# Patient Record
Sex: Female | Born: 1935 | Race: Black or African American | Hispanic: No | Smoking: Former smoker
Health system: Southern US, Community
[De-identification: ages and names within clinical notes are randomized; demographics above are authoritative.]

## PROBLEM LIST (undated history)

## (undated) DIAGNOSIS — E7800 Pure hypercholesterolemia, unspecified: Secondary | ICD-10-CM

## (undated) DIAGNOSIS — I10 Essential (primary) hypertension: Secondary | ICD-10-CM

## (undated) HISTORY — PX: TOTAL HIP ARTHROPLASTY: SHX124

## (undated) HISTORY — PX: JOINT REPLACEMENT: SHX530

---

## 2009-10-12 ENCOUNTER — Ambulatory Visit: Payer: Self-pay | Admitting: Diagnostic Radiology

## 2009-10-12 ENCOUNTER — Emergency Department (HOSPITAL_BASED_OUTPATIENT_CLINIC_OR_DEPARTMENT_OTHER): Admission: EM | Admit: 2009-10-12 | Discharge: 2009-10-12 | Payer: Self-pay | Admitting: Emergency Medicine

## 2010-11-29 LAB — BASIC METABOLIC PANEL
BUN: 12 mg/dL (ref 6–23)
CO2: 30 mEq/L (ref 19–32)
Chloride: 106 mEq/L (ref 96–112)
Glucose, Bld: 97 mg/dL (ref 70–99)
Potassium: 4 mEq/L (ref 3.5–5.1)
Sodium: 146 mEq/L — ABNORMAL HIGH (ref 135–145)

## 2010-11-29 LAB — CBC
Hemoglobin: 12.4 g/dL (ref 12.0–15.0)
MCHC: 32.7 g/dL (ref 30.0–36.0)
RBC: 4.42 MIL/uL (ref 3.87–5.11)
WBC: 5.2 10*3/uL (ref 4.0–10.5)

## 2010-11-29 LAB — DIFFERENTIAL
Basophils Relative: 0 % (ref 0–1)
Eosinophils Relative: 6 % — ABNORMAL HIGH (ref 0–5)
Lymphocytes Relative: 22 % (ref 12–46)
Lymphs Abs: 1.1 10*3/uL (ref 0.7–4.0)
Monocytes Absolute: 0.3 10*3/uL (ref 0.1–1.0)
Neutro Abs: 3.5 10*3/uL (ref 1.7–7.7)

## 2010-11-29 LAB — URINE MICROSCOPIC-ADD ON

## 2010-11-29 LAB — URINALYSIS, ROUTINE W REFLEX MICROSCOPIC
Nitrite: NEGATIVE
Protein, ur: NEGATIVE mg/dL
Specific Gravity, Urine: 1.018 (ref 1.005–1.030)
Urobilinogen, UA: 1 mg/dL (ref 0.0–1.0)

## 2010-11-29 LAB — POCT CARDIAC MARKERS
CKMB, poc: <1 ng/mL — ABNORMAL LOW (ref 1.0–8.0)
Myoglobin, poc: 82.9 ng/mL (ref 12–200)

## 2014-01-16 DIAGNOSIS — M810 Age-related osteoporosis without current pathological fracture: Secondary | ICD-10-CM | POA: Insufficient documentation

## 2014-12-18 ENCOUNTER — Emergency Department (HOSPITAL_BASED_OUTPATIENT_CLINIC_OR_DEPARTMENT_OTHER)
Admission: EM | Admit: 2014-12-18 | Discharge: 2014-12-19 | Disposition: A | Discharge status: Home / Self Care | Payer: Medicare HMO | Attending: Emergency Medicine | Admitting: Emergency Medicine

## 2014-12-18 ENCOUNTER — Encounter (HOSPITAL_BASED_OUTPATIENT_CLINIC_OR_DEPARTMENT_OTHER): Payer: Self-pay | Admitting: *Deleted

## 2014-12-18 DIAGNOSIS — Y9301 Activity, walking, marching and hiking: Secondary | ICD-10-CM | POA: Insufficient documentation

## 2014-12-18 DIAGNOSIS — S299XXA Unspecified injury of thorax, initial encounter: Secondary | ICD-10-CM | POA: Diagnosis not present

## 2014-12-18 DIAGNOSIS — Y9289 Other specified places as the place of occurrence of the external cause: Secondary | ICD-10-CM | POA: Diagnosis not present

## 2014-12-18 DIAGNOSIS — T07XXXA Unspecified multiple injuries, initial encounter: Secondary | ICD-10-CM

## 2014-12-18 DIAGNOSIS — W010XXA Fall on same level from slipping, tripping and stumbling without subsequent striking against object, initial encounter: Secondary | ICD-10-CM | POA: Diagnosis not present

## 2014-12-18 DIAGNOSIS — Y998 Other external cause status: Secondary | ICD-10-CM | POA: Diagnosis not present

## 2014-12-18 DIAGNOSIS — W19XXXA Unspecified fall, initial encounter: Secondary | ICD-10-CM

## 2014-12-18 DIAGNOSIS — T148 Other injury of unspecified body region: Secondary | ICD-10-CM | POA: Diagnosis not present

## 2014-12-18 DIAGNOSIS — S79911A Unspecified injury of right hip, initial encounter: Secondary | ICD-10-CM | POA: Insufficient documentation

## 2014-12-18 NOTE — ED Notes (Signed)
She fell 30 minutes ago. She tripped. Pain in the right side of her body.

## 2014-12-19 ENCOUNTER — Emergency Department (HOSPITAL_BASED_OUTPATIENT_CLINIC_OR_DEPARTMENT_OTHER): Payer: Medicare HMO

## 2014-12-19 NOTE — ED Provider Notes (Signed)
CSN: [id number]     Arrival date & time 12/18/14  2147 History   First MD Initiated Contact with Patient 12/19/14 0010     Chief Complaint  Patient presents with  . Fall     (Consider location/radiation/quality/duration/timing/severity/associated sxs/prior Treatment) HPI  This is a 79 year old female who was walking to her car about 9:30 PM yesterday evening. She tripped and fell landing on her right side. She is having pain in her right posterior lower ribs and in her right hip. Pain was "pretty bad" at first but it is eased off. Pain is worse with ambulation, though she is able to bear weight, and palpation. There was no loss of consciousness.  History reviewed. No pertinent past medical history. Past Surgical History  Procedure Laterality Date  . Total hip arthroplasty     No family history on file. History  Substance Use Topics  . Smoking status: Never Smoker   . Smokeless tobacco: Not on file  . Alcohol Use: No   OB History    No data available     Review of Systems  All other systems reviewed and are negative.   Allergies  Review of patient's allergies indicates no known allergies.  Home Medications   Prior to Admission medications   Not on File   BP 183/69 mmHg  Pulse 80  Temp(Src) 98 F (36.7 C) (Oral)  Resp 18  Ht 5\' 4"  (1.626 m)  Wt 130 lb (58.968 kg)  BMI 22.30 kg/m2  SpO2 98%   Physical Exam  General: Well-developed, well-nourished female in no acute distress; appearance consistent with age of record HENT: normocephalic; atraumatic Eyes: pupils equal, round and reactive to light; extraocular muscles intact Neck: supple; nontender Heart: regular rate and rhythm Lungs: clear to auscultation bilaterally; distant sounds Abdomen: soft; nondistended; nontender Back: Right lower posterior rib tenderness without crepitus Extremities: No deformity; full range of motion; pulses normal; tenderness of right inferior pubic ramus with pain on range of motion  of right hip Neurologic: Awake, alert and oriented; motor function intact in all extremities and symmetric; no facial droop Skin: Warm and dry Psychiatric: Normal mood and affect    ED Course  Procedures (including critical care time)   MDM  Nursing notes and vitals signs, including pulse oximetry, reviewed.  Summary of this visit's results, reviewed by myself:  Imaging Studies: Dg Ribs Unilateral W/chest Right  12/19/2014   CLINICAL DATA:  Right lower posterior rib pain after a fall, landing on cement this morning.  EXAM: RIGHT RIBS AND CHEST - 3+ VIEW  COMPARISON:  10/12/2009  FINDINGS: Normal heart size and pulmonary vascularity. No focal airspace disease or consolidation in the lungs. No blunting of costophrenic angles. No pneumothorax. Mediastinal contours appear intact. Calcification of the aorta. Degenerative changes in the spine and shoulders.  Right ribs appear intact. No acute displaced fractures or focal bone lesions identified.  IMPRESSION: No evidence of active pulmonary disease. No acute displaced right rib fractures.   Electronically Signed   By: Burman NievesWilliam  Stevens M.D.   On: 12/19/2014 01:18   Dg Hip Unilat With Pelvis 2-3 Views Right  12/19/2014   CLINICAL DATA:  Right hip pain after a fall landing on cement this morning. Unable to bear weight.  EXAM: RIGHT HIP (WITH PELVIS) 2-3 VIEWS  COMPARISON:  None.  FINDINGS: Pelvis appears intact. No acute displaced fractures identified. SI joints and symphysis pubis are nondisplaced.  Right total hip arthroplasty with non cemented components. Components appear well seated.  No evidence of acute fracture or dislocation. Mild displacement of the lesser trochanteric fragment is probably chronic. Postoperative changes also demonstrated in the left hip with left total hip arthroplasty.  IMPRESSION: No acute bony abnormalities demonstrated. Bilateral hip arthroplasties.   Electronically Signed   By: Burman Nieves M.D.   On: 12/19/2014 01:19    1:27 AM The patient declines analgesics. She states she will take an over-the-counter analgesic at home if necessary.  Paula Libra, MD 12/19/14 [phone]

## 2014-12-19 NOTE — ED Notes (Signed)
Steady stream of visitors, several making different/same request which the pt refuses when ask

## 2014-12-22 DIAGNOSIS — M2550 Pain in unspecified joint: Secondary | ICD-10-CM | POA: Insufficient documentation

## 2015-12-18 DIAGNOSIS — M1990 Unspecified osteoarthritis, unspecified site: Secondary | ICD-10-CM | POA: Insufficient documentation

## 2015-12-18 DIAGNOSIS — M545 Low back pain, unspecified: Secondary | ICD-10-CM | POA: Insufficient documentation

## 2016-01-27 DIAGNOSIS — E538 Deficiency of other specified B group vitamins: Secondary | ICD-10-CM | POA: Diagnosis present

## 2016-02-21 ENCOUNTER — Emergency Department (HOSPITAL_BASED_OUTPATIENT_CLINIC_OR_DEPARTMENT_OTHER): Payer: Medicare HMO

## 2016-02-21 ENCOUNTER — Encounter (HOSPITAL_BASED_OUTPATIENT_CLINIC_OR_DEPARTMENT_OTHER): Payer: Self-pay | Admitting: *Deleted

## 2016-02-21 ENCOUNTER — Emergency Department (HOSPITAL_BASED_OUTPATIENT_CLINIC_OR_DEPARTMENT_OTHER)
Admission: EM | Admit: 2016-02-21 | Discharge: 2016-02-21 | Disposition: A | Discharge status: Home / Self Care | Payer: Medicare HMO | Attending: Emergency Medicine | Admitting: Emergency Medicine

## 2016-02-21 DIAGNOSIS — Z5181 Encounter for therapeutic drug level monitoring: Secondary | ICD-10-CM | POA: Insufficient documentation

## 2016-02-21 DIAGNOSIS — Z96641 Presence of right artificial hip joint: Secondary | ICD-10-CM | POA: Insufficient documentation

## 2016-02-21 DIAGNOSIS — I10 Essential (primary) hypertension: Secondary | ICD-10-CM | POA: Diagnosis not present

## 2016-02-21 DIAGNOSIS — R42 Dizziness and giddiness: Secondary | ICD-10-CM | POA: Diagnosis present

## 2016-02-21 DIAGNOSIS — Z87891 Personal history of nicotine dependence: Secondary | ICD-10-CM | POA: Insufficient documentation

## 2016-02-21 HISTORY — DX: Essential (primary) hypertension: I10

## 2016-02-21 HISTORY — DX: Pure hypercholesterolemia, unspecified: E78.00

## 2016-02-21 LAB — CBC
HCT: 36.4 % (ref 36.0–46.0)
Hemoglobin: 11.8 g/dL — ABNORMAL LOW (ref 12.0–15.0)
MCH: 27.7 pg (ref 26.0–34.0)
MCHC: 32.4 g/dL (ref 30.0–36.0)
MCV: 85.4 fL (ref 78.0–100.0)
PLATELETS: 294 10*3/uL (ref 150–400)
RBC: 4.26 MIL/uL (ref 3.87–5.11)
RDW: 15.7 % — AB (ref 11.5–15.5)
WBC: 7.7 10*3/uL (ref 4.0–10.5)

## 2016-02-21 LAB — URINALYSIS, ROUTINE W REFLEX MICROSCOPIC
Bilirubin Urine: NEGATIVE
GLUCOSE, UA: NEGATIVE mg/dL
HGB URINE DIPSTICK: NEGATIVE
KETONES UR: NEGATIVE mg/dL
Leukocytes, UA: NEGATIVE
Nitrite: NEGATIVE
PH: 7.5 (ref 5.0–8.0)
PROTEIN: NEGATIVE mg/dL
Specific Gravity, Urine: 1.01 (ref 1.005–1.030)

## 2016-02-21 LAB — COMPREHENSIVE METABOLIC PANEL
ALT: 10 U/L — ABNORMAL LOW (ref 14–54)
ANION GAP: 7 (ref 5–15)
AST: 14 U/L — ABNORMAL LOW (ref 15–41)
Albumin: 4.1 g/dL (ref 3.5–5.0)
Alkaline Phosphatase: 86 U/L (ref 38–126)
BUN: 15 mg/dL (ref 6–20)
CHLORIDE: 104 mmol/L (ref 101–111)
CO2: 28 mmol/L (ref 22–32)
Calcium: 9.6 mg/dL (ref 8.9–10.3)
Creatinine, Ser: 0.77 mg/dL (ref 0.44–1.00)
Glucose, Bld: 141 mg/dL — ABNORMAL HIGH (ref 65–99)
Potassium: 3.6 mmol/L (ref 3.5–5.1)
SODIUM: 139 mmol/L (ref 135–145)
Total Bilirubin: 0.6 mg/dL (ref 0.3–1.2)
Total Protein: 8.1 g/dL (ref 6.5–8.1)

## 2016-02-21 LAB — RAPID URINE DRUG SCREEN, HOSP PERFORMED
Amphetamines: NOT DETECTED
BARBITURATES: NOT DETECTED
BENZODIAZEPINES: NOT DETECTED
COCAINE: NOT DETECTED
OPIATES: NOT DETECTED
TETRAHYDROCANNABINOL: NOT DETECTED

## 2016-02-21 LAB — PROTIME-INR
INR: 0.95 (ref 0.00–1.49)
PROTHROMBIN TIME: 12.9 s (ref 11.6–15.2)

## 2016-02-21 LAB — DIFFERENTIAL
BASOS PCT: 0 %
Basophils Absolute: 0 10*3/uL (ref 0.0–0.1)
EOS ABS: 0.1 10*3/uL (ref 0.0–0.7)
EOS PCT: 2 %
Lymphocytes Relative: 12 %
Lymphs Abs: 0.9 10*3/uL (ref 0.7–4.0)
MONO ABS: 0.5 10*3/uL (ref 0.1–1.0)
Monocytes Relative: 6 %
Neutro Abs: 6.2 10*3/uL (ref 1.7–7.7)
Neutrophils Relative %: 80 %

## 2016-02-21 LAB — ETHANOL

## 2016-02-21 LAB — APTT: aPTT: 31 seconds (ref 24–37)

## 2016-02-21 LAB — TROPONIN I

## 2016-02-21 MED ORDER — PROMETHAZINE HCL 25 MG/ML IJ SOLN
12.5000 mg | Freq: Once | INTRAMUSCULAR | Status: AC
  Administered 2016-02-21: 12.5 mg via INTRAVENOUS
  Filled 2016-02-21: qty 1

## 2016-02-21 MED ORDER — ACETAMINOPHEN 325 MG PO TABS
650.0000 mg | ORAL_TABLET | Freq: Once | ORAL | Status: AC
  Administered 2016-02-21: 650 mg via ORAL
  Filled 2016-02-21: qty 2

## 2016-02-21 MED ORDER — MECLIZINE HCL 25 MG PO TABS: 25.0000 mg | ORAL_TABLET | Freq: Three times a day (TID) | ORAL | Status: DC | PRN

## 2016-02-21 MED ORDER — MECLIZINE HCL 25 MG PO TABS
25.0000 mg | ORAL_TABLET | Freq: Once | ORAL | Status: AC
  Administered 2016-02-21: 25 mg via ORAL
  Filled 2016-02-21: qty 1

## 2016-02-21 NOTE — Discharge Instructions (Signed)
Benign Positional Vertigo °Vertigo is the feeling that you or your surroundings are moving when they are not. Benign positional vertigo is the most common form of vertigo. The cause of this condition is not serious (is benign). This condition is triggered by certain movements and positions (is positional). This condition can be dangerous if it occurs while you are doing something that could endanger you or others, such as driving.  °CAUSES °In many cases, the cause of this condition is not known. It may be caused by a disturbance in an area of the inner ear that helps your brain to sense movement and balance. This disturbance can be caused by a viral infection (labyrinthitis), head injury, or repetitive motion. °RISK FACTORS °This condition is more likely to develop in: °· Women. °· People who are 50 years of age or older. °SYMPTOMS °Symptoms of this condition usually happen when you move your head or your eyes in different directions. Symptoms may start suddenly, and they usually last for less than a minute. Symptoms may include: °· Loss of balance and falling. °· Feeling like you are spinning or moving. °· Feeling like your surroundings are spinning or moving. °· Nausea and vomiting. °· Blurred vision. °· Dizziness. °· Involuntary eye movement (nystagmus). °Symptoms can be mild and cause only slight annoyance, or they can be severe and interfere with daily life. Episodes of benign positional vertigo may return (recur) over time, and they may be triggered by certain movements. Symptoms may improve over time. °DIAGNOSIS °This condition is usually diagnosed by medical history and a physical exam of the head, neck, and ears. You may be referred to a health care provider who specializes in ear, nose, and throat (ENT) problems (otolaryngologist) or a provider who specializes in disorders of the nervous system (neurologist). You may have additional testing, including: °· MRI. °· A CT scan. °· Eye movement tests. Your  health care provider may ask you to change positions quickly while he or she watches you for symptoms of benign positional vertigo, such as nystagmus. Eye movement may be tested with an electronystagmogram (ENG), caloric stimulation, the Dix-Hallpike test, or the roll test. °· An electroencephalogram (EEG). This records electrical activity in your brain. °· Hearing tests. °TREATMENT °Usually, your health care provider will treat this by moving your head in specific positions to adjust your inner ear back to normal. Surgery may be needed in severe cases, but this is rare. In some cases, benign positional vertigo may resolve on its own in 2-4 weeks. °HOME CARE INSTRUCTIONS °Safety °· Move slowly. Avoid sudden body or head movements. °· Avoid driving. °· Avoid operating heavy machinery. °· Avoid doing any tasks that would be dangerous to you or others if a vertigo episode would occur. °· If you have trouble walking or keeping your balance, try using a cane for stability. If you feel dizzy or unstable, sit down right away. °· Return to your normal activities as told by your health care provider. Ask your health care provider what activities are safe for you. °General Instructions °· Take over-the-counter and prescription medicines only as told by your health care provider. °· Avoid certain positions or movements as told by your health care provider. °· Drink enough fluid to keep your urine clear or pale yellow. °· Keep all follow-up visits as told by your health care provider. This is important. °SEEK MEDICAL CARE IF: °· You have a fever. °· Your condition gets worse or you develop new symptoms. °· Your family or friends   notice any behavioral changes. °· Your nausea or vomiting gets worse. °· You have numbness or a "pins and needles" sensation. °SEEK IMMEDIATE MEDICAL CARE IF: °· You have difficulty speaking or moving. °· You are always dizzy. °· You faint. °· You develop severe headaches. °· You have weakness in your  legs or arms. °· You have changes in your hearing or vision. °· You develop a stiff neck. °· You develop sensitivity to light. °  °This information is not intended to replace advice given to you by your health care provider. Make sure you discuss any questions you have with your health care provider. °  °Document Released: 06/06/2006 Document Revised: 05/20/2015 Document Reviewed: 12/22/2014 °Elsevier Interactive Patient Education ©2016 [employer] ° °Dizziness °Dizziness is a common problem. It makes you feel unsteady or lightheaded. You may feel like you are about to pass out (faint). Dizziness can lead to injury if you stumble or fall. Anyone can get dizzy, but dizziness is more common in older adults. This condition can be caused by a number of things, including: °· Medicines. °· Dehydration. °· Illness. °HOME CARE °Following these instructions may help with your condition: °Eating and Drinking °· Drink enough fluid to keep your pee (urine) clear or pale yellow. This helps to keep you from getting dehydrated. Try to drink more clear fluids, such as water. °· Do not drink alcohol. °· Limit how much caffeine you drink or eat if told by your doctor. °· Limit how much salt you drink or eat if told by your doctor. °Activity °· Avoid making quick movements. °¨ When you stand up from sitting in a chair, steady yourself until you feel okay. °¨ In the morning, first sit up on the side of the bed. When you feel okay, stand slowly while you hold onto something. Do this until you know that your balance is fine. °· Move your legs often if you need to stand in one place for a long time. Tighten and relax your muscles in your legs while you are standing. °· Do not drive or use heavy machinery if you feel dizzy. °· Avoid bending down if you feel dizzy. Place items in your home so that they are easy for you to reach without leaning over. °Lifestyle °· Do not use any tobacco products, including cigarettes, chewing tobacco, or  electronic cigarettes. If you need help quitting, ask your doctor. °· Try to lower your stress level, such as with yoga or meditation. Talk with your doctor if you need help. °General Instructions °· Watch your dizziness for any changes. °· Take medicines only as told by your doctor. Talk with your doctor if you think that your dizziness is caused by a medicine that you are taking. °· Tell a friend or a family member that you are feeling dizzy. If he or she notices any changes in your behavior, have this person call your doctor. °· Keep all follow-up visits as told by your doctor. This is important. °GET HELP IF: °· Your dizziness does not go away. °· Your dizziness or light-headedness gets worse. °· You feel sick to your stomach (nauseous). °· You have trouble hearing. °· You have new symptoms. °· You are unsteady on your feet or you feel like the room is spinning. °GET HELP RIGHT AWAY IF: °· You throw up (vomit) or have diarrhea and are unable to eat or drink anything. °· You have trouble: °¨ Talking. °¨ Walking. °¨ Swallowing. °¨ Using your arms, hands, or legs. °·   You feel generally weak. °· You are not thinking clearly or you have trouble forming sentences. It may take a friend or family member to notice this. °· You have: °¨ Chest pain. °¨ Pain in your belly (abdomen). °¨ Shortness of breath. °¨ Sweating. °· Your vision changes. °· You are bleeding. °· You have a headache. °· You have neck pain or a stiff neck. °· You have a fever. °  °This information is not intended to replace advice given to you by your health care provider. Make sure you discuss any questions you have with your health care provider. °  °Document Released: 08/18/2011 Document Revised: 01/13/2015 Document Reviewed: 08/25/2014 °Elsevier Interactive Patient Education ©2016 [employer]vier Inc. ° °

## 2016-02-21 NOTE — ED Provider Notes (Signed)
Medical screening examination/treatment/procedure(s) were conducted as a shared visit with non-physician practitioner(s) and myself.  I personally evaluated the patient during the encounter.   EKG Interpretation   Date/Time:  Sunday February 21 2016 09:25:43 EDT Ventricular Rate:  58 PR Interval:  131 QRS Duration: 108 QT Interval:  426 QTC Calculation: 418 R Axis:   39 Text Interpretation:  Sinus rhythm Atrial premature complexes in couplets  Probable left ventricular hypertrophy Baseline wander in lead(s) V6 ST  depression in inferolatera leads, seen on prior EKG  Confirmed by Denham Mose MD,  Jadore Veals ([id number]) on 02/21/2016 9:48:46 AM      79  year old female who presents with dizziness. She has a history of hypertension, hyperlipidemia, and a remote history of vertigo. States that around 10:50 PM yesterday evening began to have the sensation of dizziness. Denies syncope or near syncope and denies that the room is spinning or moving in front of her. However does state that whenever she moves her head forward she feels that she will fall over and lose balance. Similar to when she has had vertigo before in the past but has not had this in several years. No vision or speech changes, focal numbness or weakness, vomiting, severe chest pain or back pain, or any recent illnesses. On presentation she is hypertensive with systolic blood pressures of 203/65. She did take her blood pressure medication this morning, but she states that overall she does not take her blood pressure medications, only when she has a headache. Her neuro exam is overall intact aside from inability to ambulate by herself. She has no dysmetria with finger to nose, heel-knee-shin, no evidence of bite lateral or vertical nystagmus, abnormal test of skew. No other bulbar symptoms noted as well. Presentation seem consistent with likely peripheral vertigo, aside from gait instability.  She is out of tPA window if this is consistent with a posterior  circulation stroke. she will undergo basic stroke workup, however we'll treat her for peripheral vertigo during her workup. CT head visualized and does not show intracranial bleeding or other acute processes. She is given Phenergan and has resolution of dizziness. Is able to ambulate steadily. Unlikely to be CVA, and MRI imaging not felt to be needed at this time. Strict return and follow-up instructions reviewed. She expressed understanding of all discharge instructions and felt comfortable with the plan of care.   Lavera Guiseana Duo Aubrei Bouchie, MD 02/21/16 1630

## 2016-02-21 NOTE — ED Notes (Signed)
Pt reports sudden-onset of dizziness, lightheadedness and fatigue since last night around 2100. Reports associated nausea; denies v/d, chest pain, fever. Reports associated headache. Denies blurry vision, confusion.

## 2016-02-21 NOTE — ED Notes (Signed)
PA-C at bedside 

## 2016-02-21 NOTE — ED Provider Notes (Signed)
CSN: [id number]     Arrival date & time 02/21/16  0827 History   First MD Initiated Contact with Patient 02/21/16 0902     Chief Complaint  Patient presents with  . Dizziness     (Consider location/radiation/quality/duration/timing/severity/associated sxs/prior Treatment) HPI   80 year old female with hx of HTN currently on Hyzaar presenting with c/o dizziness.  Patient report acute onset of lightheadedness/dizziness which started last night around 2100 while she was sitting in her chair watching TV. She describes sensation as "feeling like I was, pass out". She does endorse a mild headache has been waxing waning. Report feeling fatigued, felt nauseous and this morning her symptoms persist despite laying in bed. Positional change does worsen her symptoms. Otherwise patient denies fever, confusion, dysphasia, diplopia, scintillating scotoma, ringing in ear, hearing changes, neck pain, chest pain, shortness of breath, abdominal pain, back pain, focal numbness, or rash. Denies any dysuria or productive cough. Denies any recent medication changes but states that she has not been compliant with her blood pressure medication, only take it when she has headaches. No history of prior stroke or MI. She does have history of right hip pain, currently awaits for hip surgery. She takes NSAIDs on occasion for her hip pain. She is not on any blood thinning medication. She is former smoker, denies alcohol or street drug use. Denies any recent sickness. Denies room spinning sensation.  Past Medical History  Diagnosis Date  . Hypertension   . Hypercholesteremia    Past Surgical History  Procedure Laterality Date  . Total hip arthroplasty    . Joint replacement      bil hips   No family history on file. Social History  Substance Use Topics  . Smoking status: Former Games developermoker  . Smokeless tobacco: None  . Alcohol Use: No   OB History    No data available     Review of Systems  All other systems  reviewed and are negative.     Allergies  Review of patient's allergies indicates no known allergies.  Home Medications   Prior to Admission medications   Medication Sig Start Date End Date Taking? Authorizing Provider  losartan-hydrochlorothiazide (HYZAAR) 100-12.5 MG tablet Take 1 tablet by mouth daily.   Yes Historical Provider, MD  pravastatin (PRAVACHOL) 20 MG tablet Take 20 mg by mouth daily.   Yes Historical Provider, MD   BP 203/65 mmHg  Pulse 60  Temp(Src) 98.6 F (37 C) (Oral)  Resp 15  Ht 5\' 4"  (1.626 m)  Wt 63.504 kg  BMI 24.02 kg/m2  SpO2 97% Physical Exam  Constitutional: She is oriented to person, place, and time. She appears well-developed and well-nourished. No distress.  African-American female laying in bed nontoxic in appearance  HENT:  Head: Atraumatic.  Right Ear: External ear normal.  Left Ear: External ear normal.  Left TM is dull but nonerythematous  Eyes: Conjunctivae and EOM are normal. Pupils are equal, round, and reactive to light.  Neck: Normal range of motion. Neck supple. No JVD present. No thyromegaly present.  Cardiovascular: Normal rate and regular rhythm.  Exam reveals no gallop and no friction rub.   No murmur heard. Pulmonary/Chest: Effort normal and breath sounds normal.  Abdominal: Soft. There is no tenderness.  Neurological: She is alert and oriented to person, place, and time.  Neurologic exam:  Speech clear, pupils 2mm, equal round reactive to light, extraocular movements intact  Normal peripheral visual fields Cranial nerves III through XII normal including no  facial droop Follows commands, moves all extremities x4, normal strength to bilateral upper and lower extremities at all major muscle groups including grip Sensation normal to light touch  Coordination intact, no limb ataxia, finger-nose-finger normal Rapid alternating movements normal No pronator drift Gait unsteady, difficulty walking without assistance   Skin: No  rash noted.  Psychiatric: She has a normal mood and affect.  Nursing note and vitals reviewed.   ED Course  Procedures (including critical care time) Labs Review Labs Reviewed  CBC - Abnormal; Notable for the following:    Hemoglobin 11.8 (*)    RDW 15.7 (*)    All other components within normal limits  COMPREHENSIVE METABOLIC PANEL - Abnormal; Notable for the following:    Glucose, Bld 141 (*)    AST 14 (*)    ALT 10 (*)    All other components within normal limits  URINALYSIS, ROUTINE W REFLEX MICROSCOPIC (NOT AT [facility]) - Abnormal; Notable for the following:    Color, Urine STRAW (*)    All other components within normal limits  ETHANOL  PROTIME-INR  APTT  DIFFERENTIAL  URINE RAPID DRUG SCREEN, HOSP PERFORMED  TROPONIN I    Imaging Review Ct Head Wo Contrast  02/21/2016  CLINICAL DATA:  80 year old female with sudden onset dizziness and nausea EXAM: CT HEAD WITHOUT CONTRAST TECHNIQUE: Contiguous axial images were obtained from the base of the skull through the vertex without intravenous contrast. COMPARISON:  Report from prior MR imaging dated 07/13/2005 available for review. Prior images are not available. FINDINGS: Negative for acute intracranial hemorrhage, acute infarction, mass, mass effect, hydrocephalus or midline shift. Gray-white differentiation is preserved throughout. Focal confluent hypoattenuation in the bilateral left slightly greater than right parietal and occipital subcortical and deep white matter extending to the periventricular white matter. This is similar to findings described on the prior MRI and therefore favored to represent chronic microvascular ischemic white matter disease. No focal soft tissue or calvarial abnormality. Visualized globes and orbits are unremarkable. IMPRESSION: 1. No acute intracranial abnormality. 2. 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 Electronically Signed   By: Malachy Moan M.D.   On: 02/21/2016 09:55   I have personally reviewed and evaluated these images and lab results as part of my medical decision-making.   EKG Interpretation   Date/Time:  Sunday February 21 2016 09:25:43 EDT Ventricular Rate:  58 PR Interval:  131 QRS Duration: 108 QT Interval:  426 QTC Calculation: 418 R Axis:   39 Text Interpretation:  Sinus rhythm Atrial premature complexes in couplets  Probable left ventricular hypertrophy Baseline wander in lead(s) V6 ST  depression in inferolatera leads, seen on prior EKG  Confirmed by LIU MD,  DANA ([id number]) on 02/21/2016 9:48:46 AM      MDM   Final diagnoses:  BPPV (benign paroxysmal positional vertigo), unspecified laterality    BP 171/63 mmHg  Pulse 59  Temp(Src) 98.6 F (37 C) (Oral)  Resp 19  Ht 5\' 4"  (1.626 m)  Wt 63.504 kg  BMI 24.02 kg/m2  SpO2 95%   9:26 AM Pt here with c/o dizziness and a mild headache. Last normal at 2100 last night.  She is hypertensive with BP of 203/65.  Aside from an unsteady gait, her HINTs exam is normal and no other focal neuro deficits appreciated.  No active CP/SOB.  No infectious sxs.  Work up initiated, prompt transfer to get head  CT scan.  Stroke work up initiated. Does have remote hx of vertigo.  Sxs may reflect BPPV.  Will give phenergan for sxs treatment.  Swallow study and orthostatic vital sign ordered.  Care discussed with Dr. Verdie Mosher.  10:47 AM Normal orthostatic vital sign.  Pt pass swallow study.  Head CT scan without acute intracranial pathology such as bleed.  Labs are reassuring.  Minimal improvement with Phenergan at this time. Will give Meclizine.  12:34 PM Pt report feeling much better after receiving treatment.  Dizziness resolved, still feel a bit lightheadedness.  She is able to ambulate using her cane.  After discussing with Dr. Verdie Mosher, we felt her symptom is likely peripheral vertigo and less likely central vertigo such as  posterior circulation stroke.  Will provide meclizine and have pt f/u with PCP for further care.    Fayrene Helper, PA-C 02/21/16 1330  Lavera Guise, MD 02/21/16 [phone]

## 2017-04-22 ENCOUNTER — Emergency Department (HOSPITAL_BASED_OUTPATIENT_CLINIC_OR_DEPARTMENT_OTHER): Payer: Medicare HMO

## 2017-04-22 ENCOUNTER — Observation Stay (HOSPITAL_BASED_OUTPATIENT_CLINIC_OR_DEPARTMENT_OTHER)
Admission: EM | Admit: 2017-04-22 | Discharge: 2017-04-23 | Disposition: A | Discharge status: Home / Self Care | Payer: Medicare HMO | Attending: Internal Medicine | Admitting: Internal Medicine

## 2017-04-22 ENCOUNTER — Encounter (HOSPITAL_BASED_OUTPATIENT_CLINIC_OR_DEPARTMENT_OTHER): Payer: Self-pay | Admitting: Emergency Medicine

## 2017-04-22 DIAGNOSIS — Z87891 Personal history of nicotine dependence: Secondary | ICD-10-CM | POA: Diagnosis not present

## 2017-04-22 DIAGNOSIS — N179 Acute kidney failure, unspecified: Secondary | ICD-10-CM | POA: Diagnosis not present

## 2017-04-22 DIAGNOSIS — I10 Essential (primary) hypertension: Secondary | ICD-10-CM | POA: Diagnosis not present

## 2017-04-22 DIAGNOSIS — Z79899 Other long term (current) drug therapy: Secondary | ICD-10-CM | POA: Diagnosis not present

## 2017-04-22 DIAGNOSIS — Z96643 Presence of artificial hip joint, bilateral: Secondary | ICD-10-CM | POA: Diagnosis not present

## 2017-04-22 DIAGNOSIS — R55 Syncope and collapse: Principal | ICD-10-CM | POA: Diagnosis present

## 2017-04-22 DIAGNOSIS — E7800 Pure hypercholesterolemia, unspecified: Secondary | ICD-10-CM | POA: Diagnosis not present

## 2017-04-22 LAB — COMPREHENSIVE METABOLIC PANEL
ALT: 10 U/L — ABNORMAL LOW (ref 14–54)
ANION GAP: 12 (ref 5–15)
AST: 17 U/L (ref 15–41)
Albumin: 4.3 g/dL (ref 3.5–5.0)
Alkaline Phosphatase: 89 U/L (ref 38–126)
BUN: 21 mg/dL — ABNORMAL HIGH (ref 6–20)
CO2: 26 mmol/L (ref 22–32)
Calcium: 9.6 mg/dL (ref 8.9–10.3)
Chloride: 100 mmol/L — ABNORMAL LOW (ref 101–111)
Creatinine, Ser: 1.12 mg/dL — ABNORMAL HIGH (ref 0.44–1.00)
GFR calc non Af Amer: 46 mL/min — ABNORMAL LOW (ref >60)
GFR, EST AFRICAN AMERICAN: 53 mL/min — AB (ref >60)
Glucose, Bld: 116 mg/dL — ABNORMAL HIGH (ref 65–99)
Potassium: 5.1 mmol/L (ref 3.5–5.1)
SODIUM: 138 mmol/L (ref 135–145)
Total Bilirubin: 0.9 mg/dL (ref 0.3–1.2)
Total Protein: 8.2 g/dL — ABNORMAL HIGH (ref 6.5–8.1)

## 2017-04-22 LAB — CBC WITH DIFFERENTIAL/PLATELET
Basophils Absolute: 0 10*3/uL (ref 0.0–0.1)
Basophils Relative: 0 %
EOS ABS: 0 10*3/uL (ref 0.0–0.7)
EOS PCT: 0 %
HCT: 32.6 % — ABNORMAL LOW (ref 36.0–46.0)
Hemoglobin: 10.5 g/dL — ABNORMAL LOW (ref 12.0–15.0)
LYMPHS ABS: 1 10*3/uL (ref 0.7–4.0)
Lymphocytes Relative: 10 %
MCH: 27.9 pg (ref 26.0–34.0)
MCHC: 32.2 g/dL (ref 30.0–36.0)
MCV: 86.5 fL (ref 78.0–100.0)
MONO ABS: 0.5 10*3/uL (ref 0.1–1.0)
MONOS PCT: 5 %
Neutro Abs: 8.4 10*3/uL — ABNORMAL HIGH (ref 1.7–7.7)
Neutrophils Relative %: 85 %
PLATELETS: 249 10*3/uL (ref 150–400)
RBC: 3.77 MIL/uL — ABNORMAL LOW (ref 3.87–5.11)
RDW: 14.9 % (ref 11.5–15.5)
WBC: 10 10*3/uL (ref 4.0–10.5)

## 2017-04-22 LAB — TROPONIN I
TROPONIN I: 0.04 ng/mL — AB (ref <0.03)
TROPONIN I: 0.04 ng/mL — AB (ref <0.03)

## 2017-04-22 MED ORDER — SODIUM CHLORIDE 0.9% FLUSH
3.0000 mL | Freq: Two times a day (BID) | INTRAVENOUS | Status: DC
  Administered 2017-04-22 – 2017-04-23 (×2): 3 mL via INTRAVENOUS

## 2017-04-22 MED ORDER — HYDROCHLOROTHIAZIDE 12.5 MG PO CAPS: 12.5000 mg | ORAL_CAPSULE | Freq: Every day | ORAL | Status: DC

## 2017-04-22 MED ORDER — ACETAMINOPHEN 325 MG PO TABS
650.0000 mg | ORAL_TABLET | Freq: Four times a day (QID) | ORAL | Status: DC | PRN
  Administered 2017-04-23: 650 mg via ORAL
  Filled 2017-04-22: qty 2

## 2017-04-22 MED ORDER — LOSARTAN POTASSIUM 50 MG PO TABS: 100.0000 mg | ORAL_TABLET | Freq: Every day | ORAL | Status: DC

## 2017-04-22 MED ORDER — PRAVASTATIN SODIUM 20 MG PO TABS
20.0000 mg | ORAL_TABLET | Freq: Every day | ORAL | Status: DC
  Administered 2017-04-22 – 2017-04-23 (×2): 20 mg via ORAL
  Filled 2017-04-22 (×2): qty 1

## 2017-04-22 MED ORDER — BENZONATATE 100 MG PO CAPS: 200.0000 mg | ORAL_CAPSULE | Freq: Three times a day (TID) | ORAL | Status: DC | PRN

## 2017-04-22 MED ORDER — ENOXAPARIN SODIUM 40 MG/0.4ML ~~LOC~~ SOLN
40.0000 mg | SUBCUTANEOUS | Status: DC
  Administered 2017-04-22: 40 mg via SUBCUTANEOUS
  Filled 2017-04-22: qty 0.4

## 2017-04-22 MED ORDER — SODIUM CHLORIDE 0.9 % IV SOLN
INTRAVENOUS | Status: DC
  Administered 2017-04-22: 23:00:00 via INTRAVENOUS

## 2017-04-22 MED ORDER — SODIUM CHLORIDE 0.9 % IV BOLUS (SEPSIS)
1000.0000 mL | Freq: Once | INTRAVENOUS | Status: AC
  Administered 2017-04-22: 1000 mL via INTRAVENOUS

## 2017-04-22 MED ORDER — LOSARTAN POTASSIUM 50 MG PO TABS
100.0000 mg | ORAL_TABLET | Freq: Every day | ORAL | Status: DC
  Administered 2017-04-23: 100 mg via ORAL
  Filled 2017-04-22: qty 2

## 2017-04-22 MED ORDER — ACETAMINOPHEN 325 MG PO TABS
650.0000 mg | ORAL_TABLET | Freq: Once | ORAL | Status: AC
  Administered 2017-04-22: 650 mg via ORAL
  Filled 2017-04-22: qty 2

## 2017-04-22 MED ORDER — HYDROCHLOROTHIAZIDE 12.5 MG PO CAPS
12.5000 mg | ORAL_CAPSULE | Freq: Every day | ORAL | Status: DC
  Administered 2017-04-23: 12.5 mg via ORAL
  Filled 2017-04-22: qty 1

## 2017-04-22 MED ORDER — HYDRALAZINE HCL 20 MG/ML IJ SOLN
5.0000 mg | INTRAMUSCULAR | Status: DC | PRN
  Administered 2017-04-23: 5 mg via INTRAVENOUS
  Filled 2017-04-22 (×2): qty 1

## 2017-04-22 MED ORDER — MECLIZINE HCL 25 MG PO TABS: 25.0000 mg | ORAL_TABLET | Freq: Three times a day (TID) | ORAL | Status: DC | PRN

## 2017-04-22 MED ORDER — LOSARTAN POTASSIUM-HCTZ 100-12.5 MG PO TABS: 1.0000 | ORAL_TABLET | Freq: Every day | ORAL | Status: DC

## 2017-04-22 NOTE — ED Notes (Signed)
Date and time results received: 04/22/17, 1627  Test:Troponin  Critical Value: 0.04  [facility] Not[facility]   Orders Received? Or Actions Taken?:

## 2017-04-22 NOTE — ED Notes (Signed)
Report given to Kelly, RN.

## 2017-04-22 NOTE — ED Notes (Signed)
Patient states she has a headache but does not want any medication. Pain 2 out of 10

## 2017-04-22 NOTE — ED Notes (Signed)
Pt on cardiac monitor and auto VS 

## 2017-04-22 NOTE — ED Notes (Signed)
Sprite and graham crackers given to patient, ok per MD

## 2017-04-22 NOTE — Progress Notes (Signed)
received call from [facility] regarding observation placement for 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 Also found dehydrated and with mild AKI. Troponin borderline elevated, most likely in setting of AKI. No CP reported. accepted to telemetry for further evaluation and treatment. Some non-specific EKG changes appreciated on V1-V2 (T wave inversion)   Vassie LollMadera, Ronnel Zuercher MD [phone]

## 2017-04-22 NOTE — H&P (Signed)
History and Physical    Crystal Richard Jeffords EXB:[id number] DOB: 09/21/1935 DOA: 04/22/2017  PCP: Cheron SchaumannVelazquez, Gretchen Y., MD  Patient coming from: Home  I have personally briefly reviewed patient's old medical records [facility]  Chief Complaint: Syncope  HPI: Crystal Richard Oland is a 81 y.o. female with medical history significant of HTN, HLD.  [facility] a near-syncopal event.  Had been going door to door outside in the heat and hadnt drank any water today prior to event.  No CP, SOB before, during, or after the event.   ED Course: Trop of 0.04, Creat of 1.1   Review of Systems: As per HPI otherwise 10 point review of systems negative.   Past Medical History:  Diagnosis Date  . Hypercholesteremia   . Hypertension     Past Surgical History:  Procedure Laterality Date  . JOINT REPLACEMENT     bil hips  . TOTAL HIP ARTHROPLASTY       reports that she has quit smoking. She has never used smokeless tobacco. She reports that she does not drink alcohol or use drugs.  No Known Allergies  History reviewed. No pertinent family history.   Prior to Admission medications   Medication Sig Start Date End Date Taking? Authorizing Provider  losartan-hydrochlorothiazide (HYZAAR) 100-12.5 MG tablet Take 1 tablet by mouth daily.    [provider]  meclizine (ANTIVERT) 25 MG tablet Take 1 tablet (25 mg total) by mouth 3 (three) times daily as needed for dizziness or nausea. 02/21/16   Fayrene Helperran, [place], PA-C  pravastatin (PRAVACHOL) 20 MG tablet Take 20 mg by mouth daily.    [provider]    Physical Exam: Vitals:   04/22/17 1830 04/22/17 1900 04/22/17 2000 04/22/17 2106  BP: (!) 119/107 (!) 132/57 (!) 150/61 (!) 176/54  Pulse: 75 69 66 62  Resp: 14 15 16 16   Temp:    98.9 F (37.2 C)  TempSrc:    Oral  SpO2: 100% 100% 100% 100%  Weight:      Height:        Constitutional: NAD, calm, comfortable Eyes: PERRL, lids and conjunctivae  normal ENMT: Mucous membranes are moist. Posterior pharynx clear of any exudate or lesions.Normal dentition.  Neck: normal, supple, no masses, no thyromegaly Respiratory: clear to auscultation bilaterally, no wheezing, no crackles. Normal respiratory effort. No accessory muscle use.  Cardiovascular: Regular rate and rhythm, no murmurs / rubs / gallops. No extremity edema. 2+ pedal pulses. No carotid bruits.  Abdomen: no tenderness, no masses palpated. No hepatosplenomegaly. Bowel sounds positive.  Musculoskeletal: no clubbing / cyanosis. No joint deformity upper and lower extremities. Good ROM, no contractures. Normal muscle tone.  Skin: no rashes, lesions, ulcers. No induration Neurologic: CN 2-12 grossly intact. Sensation intact, DTR normal. Strength 5/5 in all 4.  Psychiatric: Normal judgment and insight. Alert and oriented x 3. Normal mood.    Labs on Admission: I have personally reviewed following labs and imaging studies  CBC:  Recent Labs Lab 04/22/17 1544  WBC 10.0  NEUTROABS 8.4*  HGB 10.5*  HCT 32.6*  MCV 86.5  PLT 249   Basic Metabolic Panel:  Recent Labs Lab 04/22/17 1544  NA 138  K 5.1  CL 100*  CO2 26  GLUCOSE 116*  BUN 21*  CREATININE 1.12*  CALCIUM 9.6   GFR: Estimated Creatinine Clearance: 37.5 mL/min (A) (by C-G formula based on SCr of 1.12 mg/dL (H)). Liver Function Tests:  Recent  Labs Lab 04/22/17 1544  AST 17  ALT 10*  ALKPHOS 89  BILITOT 0.9  PROT 8.2*  ALBUMIN 4.3   No results for input(s): LIPASE, AMYLASE in the last 168 hours. No results for input(s): AMMONIA in the last 168 hours. Coagulation Profile: No results for input(s): INR, PROTIME in the last 168 hours. Cardiac Enzymes:  Recent Labs Lab 04/22/17 1544  TROPONINI 0.04*   BNP (last 3 results) No results for input(s): PROBNP in the last 8760 hours. HbA1C: No results for input(s): HGBA1C in the last 72 hours. CBG: No results for input(s): GLUCAP in the last 168  hours. Lipid Profile: No results for input(s): CHOL, HDL, LDLCALC, TRIG, CHOLHDL, LDLDIRECT in the last 72 hours. Thyroid Function Tests: No results for input(s): TSH, T4TOTAL, FREET4, T3FREE, THYROIDAB in the last 72 hours. Anemia Panel: No results for input(s): VITAMINB12, FOLATE, FERRITIN, TIBC, IRON, RETICCTPCT in the last 72 hours. Urine analysis:    Component Value Date/Time   COLORURINE STRAW (A) 02/21/2016 1000   APPEARANCEUR CLEAR 02/21/2016 1000   LABSPEC 1.010 02/21/2016 1000   PHURINE 7.5 02/21/2016 1000   GLUCOSEU NEGATIVE 02/21/2016 1000   HGBUR NEGATIVE 02/21/2016 1000   BILIRUBINUR NEGATIVE 02/21/2016 1000   KETONESUR NEGATIVE 02/21/2016 1000   PROTEINUR NEGATIVE 02/21/2016 1000   UROBILINOGEN 1.0 10/12/2009 1004   NITRITE NEGATIVE 02/21/2016 1000   LEUKOCYTESUR NEGATIVE 02/21/2016 1000    Radiological Exams on Admission: Dg Chest 2 View  Result Date: 04/22/2017 CLINICAL DATA:  Chest pain EXAM: CHEST  2 VIEW COMPARISON:  05/11/2016 FINDINGS: Cardiac shadow is within normal limits. Aortic calcifications are seen. The lungs are well aerated bilaterally. No acute bony abnormality is noted. IMPRESSION: No active cardiopulmonary disease. Electronically Signed   By: Alcide Clever M.D.   On: 04/22/2017 15:48   Ct Head Wo Contrast  Result Date: 04/22/2017 CLINICAL DATA:  Dizziness EXAM: CT HEAD WITHOUT CONTRAST TECHNIQUE: Contiguous axial images were obtained from the base of the skull through the vertex without intravenous contrast. COMPARISON:  02/21/2016 FINDINGS: Brain: No evidence of acute infarction, hemorrhage, hydrocephalus, extra-axial collection or mass lesion/mass effect. Vascular: No hyperdense vessel or unexpected calcification. Skull: Normal. Negative for fracture or focal lesion. Sinuses/Orbits: No acute finding. Other: None. IMPRESSION: No acute intracranial abnormality noted. Electronically Signed   By: Alcide Clever M.D.   On: 04/22/2017 15:47    EKG:  Independently reviewed.  Assessment/Plan Principal Problem:   Near syncope    1. Near syncope - 1. Probably dehydration 2. Syncope pathway 3. Tele monitor 4. Serial trops 5. 2d echo 6. IVF: 1L bolus in ED and 100 cc/hr 7. Repeat BMP ordered for AM  DVT prophylaxis: Lovenox Code Status: Full Code, Note: Patient is a Jehova's witness and refuses blood transfusions Family Communication: Family at bedside Disposition Plan: Home after admit Consults called: None Admission status: Place in [place], Heywood Iles. DO [facility] Pager [phone]  If 7AM-7PM, please contact day team taking care of patient [email or web address] Password [email or web address]  04/22/2017, 10:04 PM

## 2017-04-22 NOTE — ED Triage Notes (Signed)
Patient states that she was out walking and started to get dizzy. The patient went to an urgent care and was sent here to have her heart evaluated,. Denies any dizziness while sitting. REports increased dizzy with head movement. [facility] and here

## 2017-04-22 NOTE — ED Provider Notes (Signed)
[facility] Provider Note   CSN: [id number] Arrival date & time: 04/22/17  1356     History   Chief Complaint Chief Complaint  Patient presents with  . Dizziness    HPI Crystal Richard is a 81 y.o. female.  HPI   81 year old Jehovah's Witness presenting today after presyncope while walking around today. Patient was going toward door to  the door today with when she all of a sudden felt very sweaty, heated, dry mouth and felt dry mouth.Patient felt very dizzy. It took her roughly an hour to feel better. Micah Flesher to urgent care and urgent care center here for cardiac workup.  No chest pain.    Past Medical History:  Diagnosis Date  . Hypercholesteremia   . Hypertension     There are no active problems to display for this patient.   Past Surgical History:  Procedure Laterality Date  . JOINT REPLACEMENT     bil hips  . TOTAL HIP ARTHROPLASTY      OB History    No data available       Home Medications    Prior to Admission medications   Medication Sig Start Date End Date Taking? Authorizing Provider  losartan-hydrochlorothiazide (HYZAAR) 100-12.5 MG tablet Take 1 tablet by mouth daily.    [provider]  meclizine (ANTIVERT) 25 MG tablet Take 1 tablet (25 mg total) by mouth 3 (three) times daily as needed for dizziness or nausea. 02/21/16   Fayrene Helper, PA-C  pravastatin (PRAVACHOL) 20 MG tablet Take 20 mg by mouth daily.    [provider]    Family History History reviewed. No pertinent family history.  Social History Social History  Substance Use Topics  . Smoking status: Former Games developer  . Smokeless tobacco: Never Used  . Alcohol use No     Allergies   Patient has no known allergies.   Review of Systems Review of Systems  Constitutional: Negative for activity change.  Respiratory: Negative for shortness of breath.   Cardiovascular: Negative for chest pain.  Gastrointestinal: Negative for abdominal pain.    Neurological: Positive for headaches.     Physical Exam Updated Vital Signs BP (!) 156/65 (BP Location: Right Arm) Comment: Simultaneous filing. User may not have seen previous data.  Pulse 72   Temp 98.3 F (36.8 C) (Oral)   Resp 12   Ht 5\' 3"  (1.6 m)   Wt 64.9 kg (143 lb)   SpO2 100%   BMI 25.33 kg/m   Physical Exam  Constitutional: She is oriented to person, place, and time. She appears well-developed and well-nourished.  HENT:  Head: Normocephalic and atraumatic.  Eyes: Right eye exhibits no discharge.  Cardiovascular: Normal rate, regular rhythm and normal heart sounds.   No murmur heard. Pulmonary/Chest: Effort normal and breath sounds normal. She has no wheezes. She has no rales.  Abdominal: Soft. She exhibits no distension. There is no tenderness.  Neurological: She is oriented to person, place, and time.  Equal strength bilaterally upper and lower extremities negative pronator drift. Normal sensation bilaterally. Speech comprehensible, no slurring. Facial nerve tested and appears grossly normal. Alert and oriented 3.   Skin: Skin is warm and dry. She is not diaphoretic.  Psychiatric: She has a normal mood and affect.  Nursing note and vitals reviewed.    ED Treatments / Results  Labs (all labs ordered are listed, but only abnormal results are displayed) Labs Reviewed  CBC WITH DIFFERENTIAL/PLATELET - Abnormal; Notable for the  following:       Result Value   RBC 3.77 (*)    Hemoglobin 10.5 (*)    HCT 32.6 (*)    Neutro Abs 8.4 (*)    All other components within normal limits  COMPREHENSIVE METABOLIC PANEL - Abnormal; Notable for the following:    Chloride 100 (*)    Glucose, Bld 116 (*)    BUN 21 (*)    Creatinine, Ser 1.12 (*)    Total Protein 8.2 (*)    ALT 10 (*)    GFR calc non Af Amer 46 (*)    GFR calc Af Amer 53 (*)    All other components within normal limits  TROPONIN I - Abnormal; Notable for the following:    Troponin I 0.04 (*)    All  other components within normal limits    EKG  EKG Interpretation  Date/Time:  Saturday April 22 2017 14:12:57 EDT Ventricular Rate:  69 PR Interval:  136 QRS Duration: 86 QT Interval:  406 QTC Calculation: 435 R Axis:   18 Text Interpretation:  Normal sinus rhythm Normal ECG similar to prior Confirmed by Corlis LeakMackuen, Courteney ([id number]) on 04/22/2017 2:57:19 PM       Radiology Dg Chest 2 View  Result Date: 04/22/2017 CLINICAL DATA:  Chest pain EXAM: CHEST  2 VIEW COMPARISON:  05/11/2016 FINDINGS: Cardiac shadow is within normal limits. Aortic calcifications are seen. The lungs are well aerated bilaterally. No acute bony abnormality is noted. IMPRESSION: No active cardiopulmonary disease. Electronically Signed   By: Alcide CleverMark  Lukens M.D.   On: 04/22/2017 15:48   Ct Head Wo Contrast  Result Date: 04/22/2017 CLINICAL DATA:  Dizziness EXAM: CT HEAD WITHOUT CONTRAST TECHNIQUE: Contiguous axial images were obtained from the base of the skull through the vertex without intravenous contrast. COMPARISON:  02/21/2016 FINDINGS: Brain: No evidence of acute infarction, hemorrhage, hydrocephalus, extra-axial collection or mass lesion/mass effect. Vascular: No hyperdense vessel or unexpected calcification. Skull: Normal. Negative for fracture or focal lesion. Sinuses/Orbits: No acute finding. Other: None. IMPRESSION: No acute intracranial abnormality noted. Electronically Signed   By: Alcide CleverMark  Lukens M.D.   On: 04/22/2017 15:47    Procedures Procedures (including critical care time)  Medications Ordered in ED Medications  sodium chloride 0.9 % bolus 1,000 mL (1,000 mLs Intravenous New Bag/Given 04/22/17 1551)     Initial Impression / Assessment and Plan / ED Course  I have reviewed the triage vital signs and the nursing notes.  Pertinent labs & imaging results that were available during my care of the patient were reviewed by me and considered in my medical decision making (see chart for details).      Patient is a 81 year old female presenting with presyncope.  I believe is likely heat related. Patient was outside today for couple hours and above 90 heat. I think this is likely related to the environmental effects. However given her headache, we will get CT head given her concerns and then do baseline labwork including a troponin given the concerns of the previous provider who sent her here. This event happened over 6 hours ago so I do anticipate a positive troponin if it were cardiac in nature. However , It does not sound typically cardiac in nature.  4:46 PM She has a hemoglobin of 10.5 which is new for her. Patient denies any kind of black stools. She does report feeling weak and having a headache. Additionally patient has elevated troponin. Patient EKG is changed from her prior, she  has inverted T waves in V1 and V 2 which is new. In addition patient has mild AKI. For these reasons we'll fluid hydrate her, do serial troponins, and admit for observation.    Final Clinical Impressions(s) / ED Diagnoses   Final diagnoses:  None    New Prescriptions New Prescriptions   No medications on file     Abelino Derrick, MD 04/22/17 1646

## 2017-04-23 ENCOUNTER — Observation Stay (HOSPITAL_BASED_OUTPATIENT_CLINIC_OR_DEPARTMENT_OTHER): Payer: Medicare HMO

## 2017-04-23 DIAGNOSIS — R55 Syncope and collapse: Secondary | ICD-10-CM

## 2017-04-23 DIAGNOSIS — I351 Nonrheumatic aortic (valve) insufficiency: Secondary | ICD-10-CM | POA: Diagnosis not present

## 2017-04-23 LAB — BASIC METABOLIC PANEL
Anion gap: 7 (ref 5–15)
BUN: 17 mg/dL (ref 6–20)
CALCIUM: 9.2 mg/dL (ref 8.9–10.3)
CHLORIDE: 108 mmol/L (ref 101–111)
CO2: 26 mmol/L (ref 22–32)
CREATININE: 1.01 mg/dL — AB (ref 0.44–1.00)
GFR, EST NON AFRICAN AMERICAN: 52 mL/min — AB (ref >60)
Glucose, Bld: 89 mg/dL (ref 65–99)
Potassium: 4.3 mmol/L (ref 3.5–5.1)
SODIUM: 141 mmol/L (ref 135–145)

## 2017-04-23 LAB — TROPONIN I: Troponin I: <0.03 ng/mL (ref <0.03)

## 2017-04-23 LAB — GLUCOSE, CAPILLARY
GLUCOSE-CAPILLARY: 87 mg/dL (ref 65–99)
GLUCOSE-CAPILLARY: 85 mg/dL (ref 65–99)

## 2017-04-23 LAB — ECHOCARDIOGRAM COMPLETE
Height: 63 in
WEIGHTICAEL: 2288 [oz_av]

## 2017-04-23 MED ORDER — LOSARTAN POTASSIUM 100 MG PO TABS: 100.0000 mg | ORAL_TABLET | Freq: Every day | ORAL | 0 refills | Status: DC

## 2017-04-23 NOTE — Discharge Summary (Signed)
Discharge Summary  Crystal Richard Pflug ZOX:[id number] DOB: 12/25/1935  PCP: Cheron SchaumannVelazquez, Gretchen Y., MD  Admit date: 04/22/2017 Discharge date: 04/23/2017  Time spent: <6030mins  Recommendations for Outpatient Follow-up:  1. F/u with PMD within a week  for hospital discharge follow up, repeat cbc/bmp at follow up  Discharge Diagnoses:  Active Hospital Problems   Diagnosis Date Noted  . Near syncope 04/22/2017    Resolved Hospital Problems   Diagnosis Date Noted Date Resolved  No resolved problems to display.    Discharge Condition: stable  Diet recommendation: heart healthy  Filed Weights   04/22/17 1409  Weight: 64.9 kg (143 lb)    History of present illness:  PCP: Cheron SchaumannVelazquez, Gretchen Y., MD  Patient coming from: Home  I have personally briefly reviewed patient's [facility]  Chief Complaint: Syncope  HPI: Crystal Richard Nowland is a 81 y.o. female with medical history significant of HTN, HLD.  [facility] after a near-syncopal event.  Had been going door to door outside in the heat and hadnt drank any water today prior to event.  No CP, SOB before, during, or after the event.    ED Course: Trop of 0.04, Creat of 1.1   Hospital Course:  Principal Problem:   Near syncope  Near syncope:  likely from dehydration, patient report did not eat much in the morning, she went out after taking her blood pressure meds losartan/hctz. No infection,CT head no acute findings,cxr no acute findings, ua unremarkable, no chest pain, no hypoxia, no sob, ekg no acute st/t changes. Troponin unremarkable, Tele sinus rhythm, no arrhythmia. Echocardiogram: no wall motion abnormalities "Normal LV systolic function; mild diastolic dysfunction; mild   LVH; mild AI; trace TR with mildly elevated pulmonary pressure."  she received hydration, initially mildly elevated bun/cr 21/1.12 has improved after hydration, she feels back to normal self. She agreed  to be discharged home and close follow up with pmd.  HTN: hctz discontinued, she is continued on losartan, she is advised to take blood pressure at home and bring in record for her primary care doctor to review and continue adjust blood pressure meds.   Code Status: Full Code, Note: Patient is a Jehova's witness and refuses blood transfusions Family Communication: Family at bedside Disposition Plan: d/c home  Procedures:  none  Consultations:  none  Discharge Exam: BP 138/70   Pulse 74   Temp 98.6 F (37 C) (Oral)   Resp 16   Ht 5\' 3"  (1.6 m)   Wt 64.9 kg (143 lb)   SpO2 98%   BMI 25.33 kg/m   General: NAD Cardiovascular: RRR Respiratory: CTABL Extremity: no edema Neuro: aaox3, no focal deficit  Discharge Instructions You were cared for by a hospitalist during your hospital stay. If you have any questions about your discharge medications or the care you received while you were in the hospital after you are discharged, you can call the unit and asked to speak with the hospitalist on call if the hospitalist that took care of you is not available. Once you are discharged, your primary care physician will handle any further medical issues. Please note that NO REFILLS for any discharge medications will be authorized once you are discharged, as it is imperative that you return to your primary care physician (or establish a relationship with a primary care physician if you do not have one) for your aftercare needs so that they can reassess your need for medications and  monitor your lab values.  Discharge Instructions    Diet - low sodium heart healthy    Complete by:  As directed    Increase activity slowly    Complete by:  As directed      Allergies as of 04/23/2017   No Known Allergies     Medication List    STOP taking these medications   losartan-hydrochlorothiazide 100-12.5 MG tablet Commonly known as:  HYZAAR     TAKE these medications   acetaminophen 500 MG  tablet Commonly known as:  TYLENOL Take 500 mg by mouth every 6 (six) hours as needed for headache (pain).   losartan 100 MG tablet Commonly known as:  COZAAR Take 1 tablet (100 mg total) by mouth daily.   meclizine 25 MG tablet Commonly known as:  ANTIVERT Take 1 tablet (25 mg total) by mouth 3 (three) times daily as needed for dizziness or nausea.   pravastatin 40 MG tablet Commonly known as:  PRAVACHOL Take 20 mg by mouth at bedtime.      No Known Allergies Follow-up Information    Cheron Schaumann., MD Follow up in 1 week(s).   Specialty:  Internal Medicine Why:  hospital discharge follow up please check your blood pressure at home, bring record for your doctor to reveiw.  consider for headache sepcilist referral if headache dose not improve.  Contact information: [address] [place] [address] [phone]            The results of significant diagnostics from this hospitalization (including imaging, microbiology, ancillary and laboratory) are listed below for reference.    Significant Diagnostic Studies: Dg Chest 2 View  Result Date: 04/22/2017 CLINICAL DATA:  Chest pain EXAM: CHEST  2 VIEW COMPARISON:  05/11/2016 FINDINGS: Cardiac shadow is within normal limits. Aortic calcifications are seen. The lungs are well aerated bilaterally. No acute bony abnormality is noted. IMPRESSION: No active cardiopulmonary disease. Electronically Signed   By: Alcide Clever M.D.   On: 04/22/2017 15:48   Ct Head Wo Contrast  Result Date: 04/22/2017 CLINICAL DATA:  Dizziness EXAM: CT HEAD WITHOUT CONTRAST TECHNIQUE: Contiguous axial images were obtained from the base of the skull through the vertex without intravenous contrast. COMPARISON:  02/21/2016 FINDINGS: Brain: No evidence of acute infarction, hemorrhage, hydrocephalus, extra-axial collection or mass lesion/mass effect. Vascular: No hyperdense vessel or unexpected calcification. Skull: Normal. Negative for fracture or  focal lesion. Sinuses/Orbits: No acute finding. Other: None. IMPRESSION: No acute intracranial abnormality noted. Electronically Signed   By: Alcide Clever M.D.   On: 04/22/2017 15:47    Microbiology: No results found for this or any previous visit (from the past 240 hour(s)).   Labs: Basic Metabolic Panel:  Recent Labs Lab 04/22/17 1544 04/23/17 0330  NA 138 141  K 5.1 4.3  CL 100* 108  CO2 26 26  GLUCOSE 116* 89  BUN 21* 17  CREATININE 1.12* 1.01*  CALCIUM 9.6 9.2   Liver Function Tests:  Recent Labs Lab 04/22/17 1544  AST 17  ALT 10*  ALKPHOS 89  BILITOT 0.9  PROT 8.2*  ALBUMIN 4.3   No results for input(s): LIPASE, AMYLASE in the last 168 hours. No results for input(s): AMMONIA in the last 168 hours. CBC:  Recent Labs Lab 04/22/17 1544  WBC 10.0  NEUTROABS 8.4*  HGB 10.5*  HCT 32.6*  MCV 86.5  PLT 249   Cardiac Enzymes:  Recent Labs Lab 04/22/17 1544 04/22/17 2113 04/23/17 0330 04/23/17 0930  TROPONINI 0.04* 0.04* <0.03 <0.03   BNP: BNP (last 3 results) No results for input(s): BNP in the last 8760 hours.  ProBNP (last 3 results) No results for input(s): PROBNP in the last 8760 hours.  CBG:  Recent Labs Lab 04/23/17 0621 04/23/17 1238  GLUCAP 87 85       Signed:  Alaila Pillard MD, PhD  [facility] 04/23/2017, 7:59 PM

## 2017-04-23 NOTE — Progress Notes (Signed)
  Echocardiogram 2D Echocardiogram has been performed.  Crystal Richard 04/23/2017, 9:33 AM

## 2017-04-23 NOTE — Progress Notes (Signed)
Pt arrived room via carelink. Pt assessed to be AxOx4. No complaints of pain. [facility] paged for MD admission. Vitals signs completed. Skin assessment completed. Will continue to monitor.

## 2017-04-23 NOTE — Progress Notes (Addendum)
CRITICAL VALUE ALERT  Critical Value:  Troponin - 0.04 Date & Time Notied:  10:57pm; 04/22/17  Provider Notified: Julian ReilGardner, MD  Orders Received/Actions taken: N/A. Will continue to monitor.

## 2019-05-03 DIAGNOSIS — N1831 Chronic kidney disease, stage 3a: Secondary | ICD-10-CM | POA: Diagnosis present

## 2019-06-06 IMAGING — CT CT HEAD W/O CM
3 series · 15 of 47 positions shown, 18 images · non-contrast
Comparison: 02/21/2016

CLINICAL DATA: Dizziness

EXAM:
CT HEAD WITHOUT CONTRAST
TECHNIQUE: Contiguous axial images were obtained from the base of the skull
through the vertex without intravenous contrast.

[Series 2: head wo · axial · 0.49mm/px · z∈[-179,-49]mm · 9 of 32 slices shown, 12 images]
[im 3/32  brain]
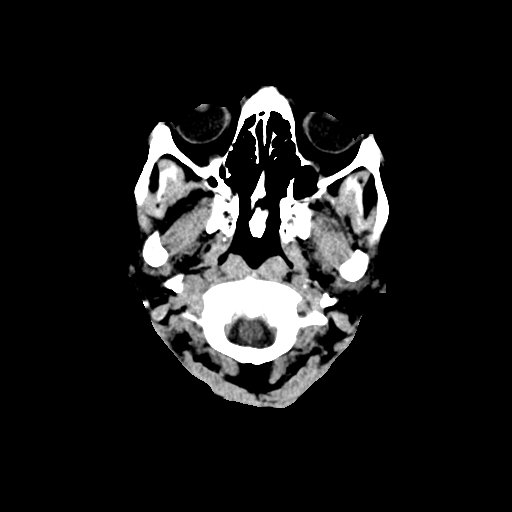
[im 3/32  bone]
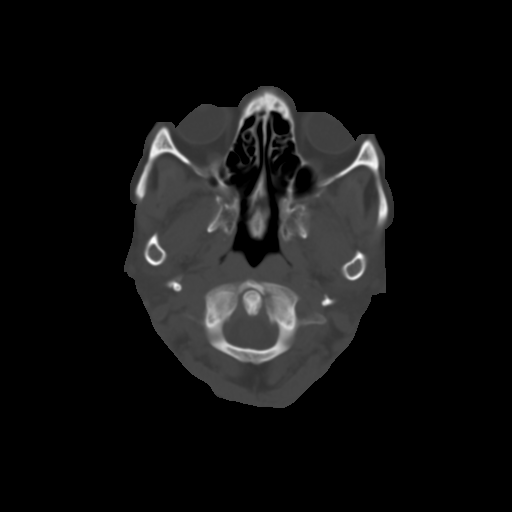
[im 6/32  brain]
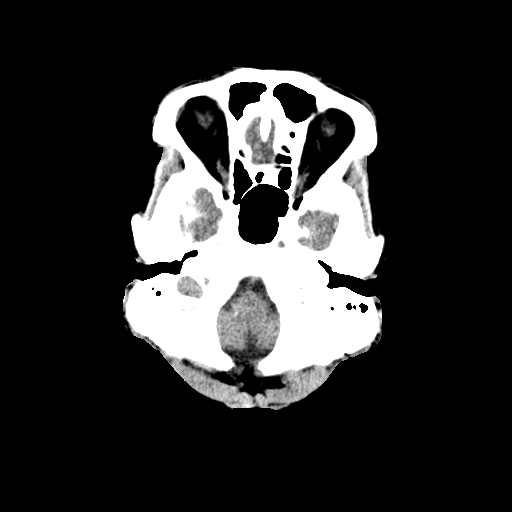
[im 9/32  brain]
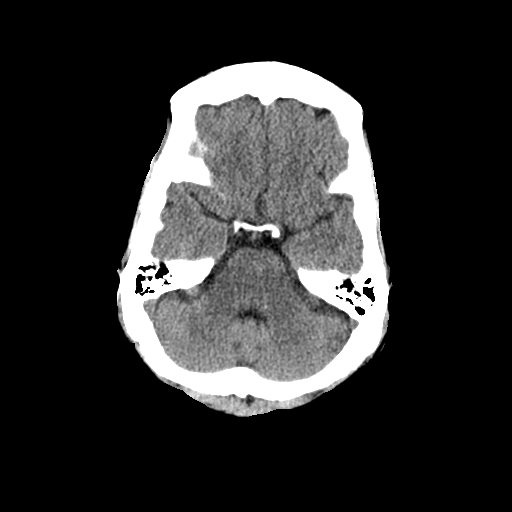
[im 12/32  brain]
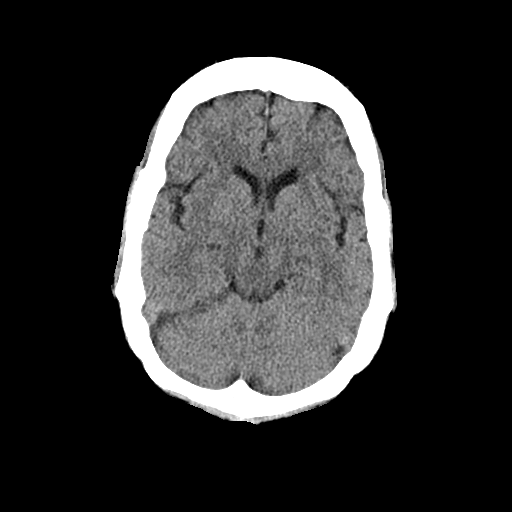
[im 17/32  brain]
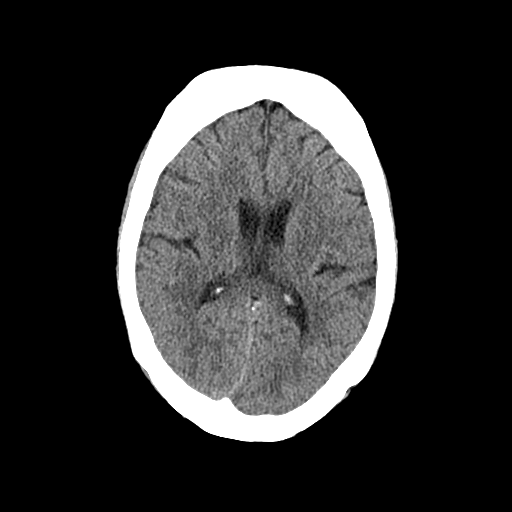
[im 17/32  bone]
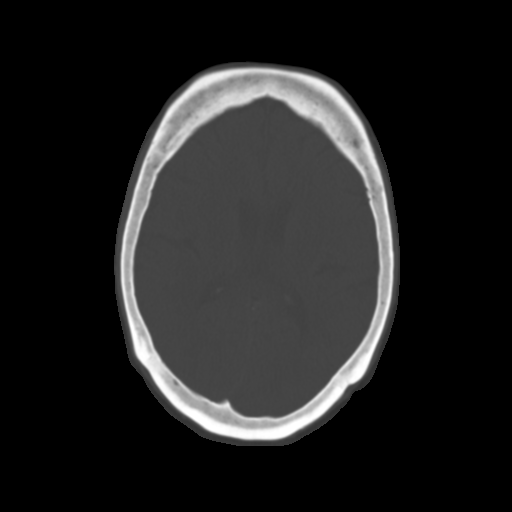
[im 20/32  brain]
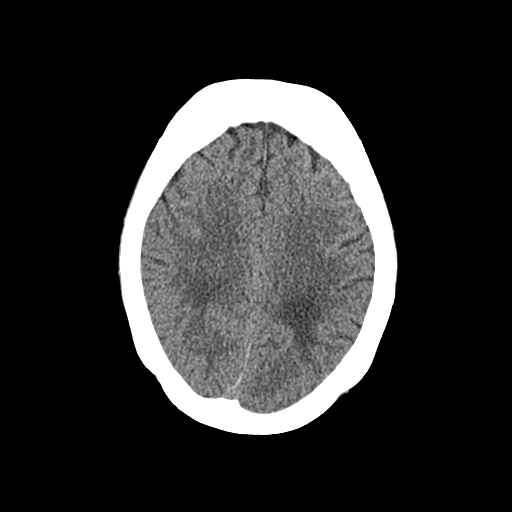
[im 23/32  brain]
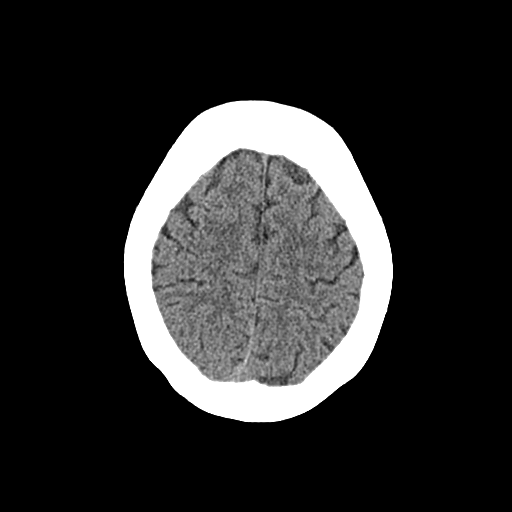
[im 26/32  brain]
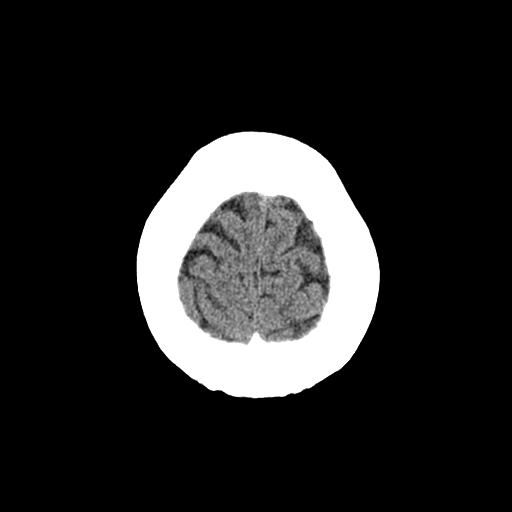
[im 29/32  brain]
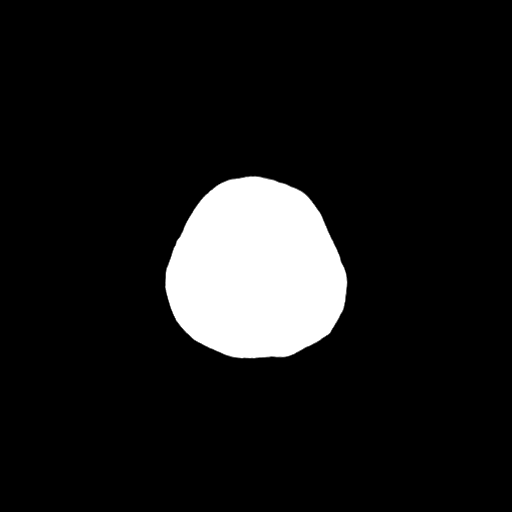
[im 29/32  bone]
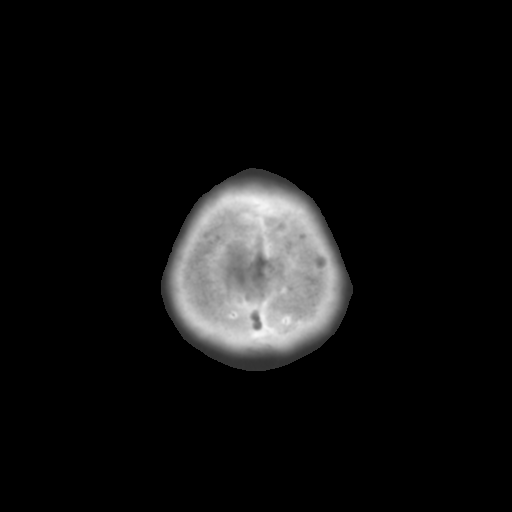

[Series 4: cor soft · coronal · 0.31mm/px · 3 of 63 slices shown]
[im 21/63  brain]
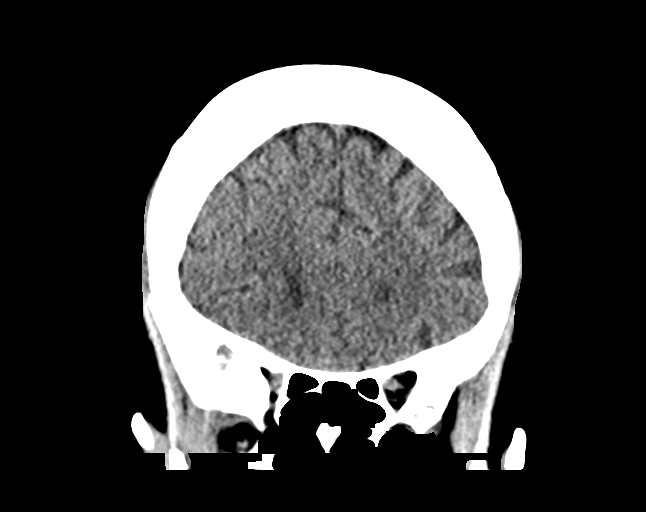
[im 28/63  brain]
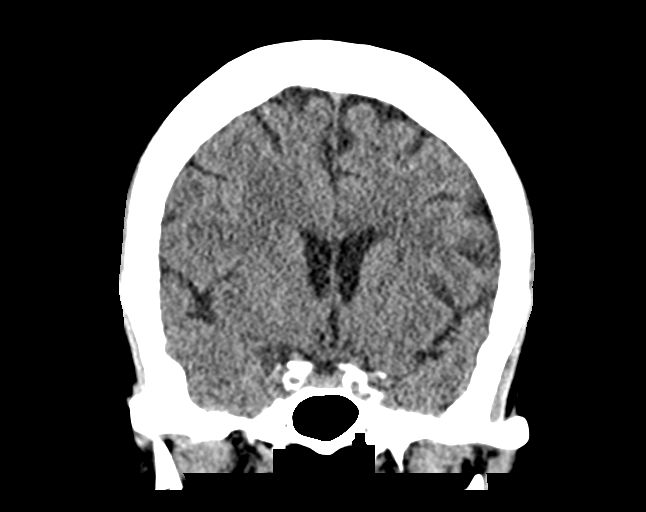
[im 35/63  brain]
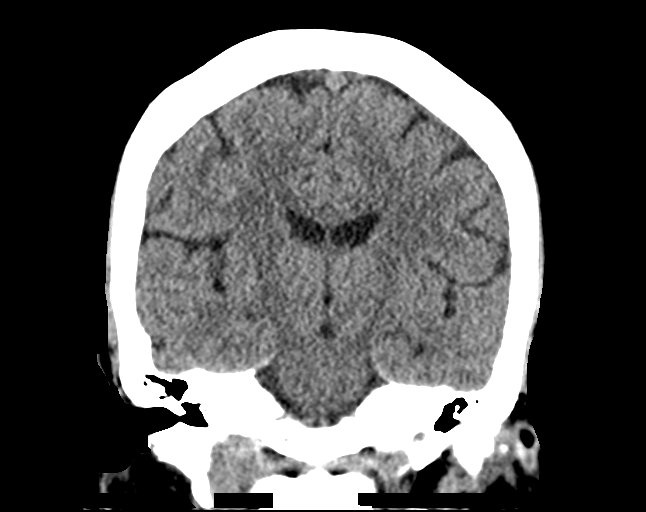

[Series 5: sag soft · sagittal · 0.31mm/px · 3 of 54 slices shown]
[im 18/54  brain]
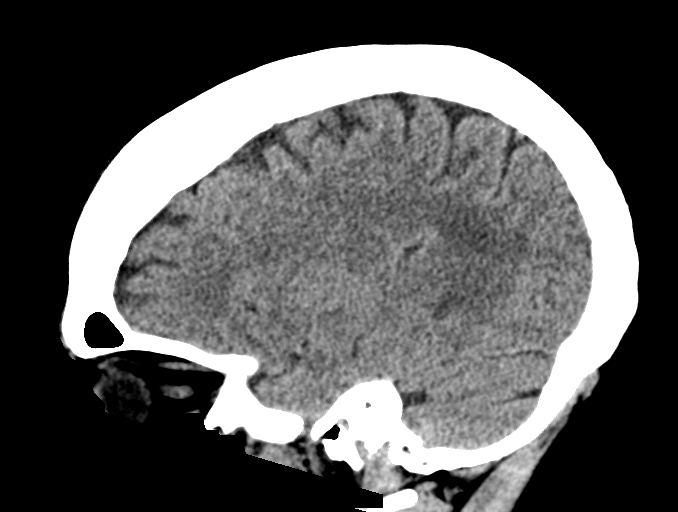
[im 27/54  brain]
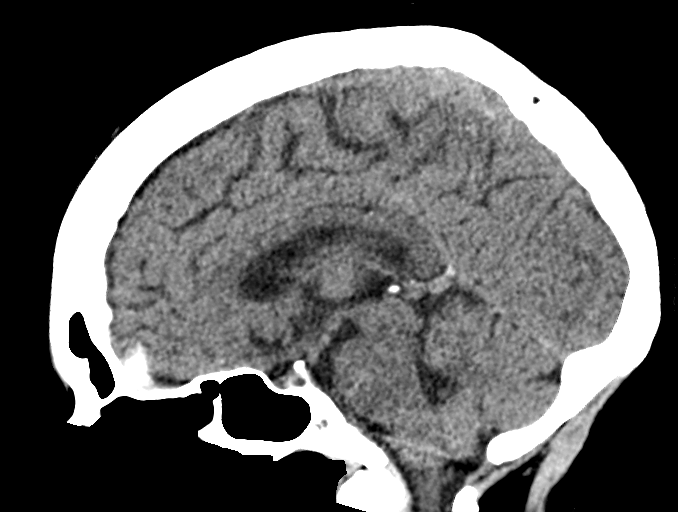
[im 36/54  brain]
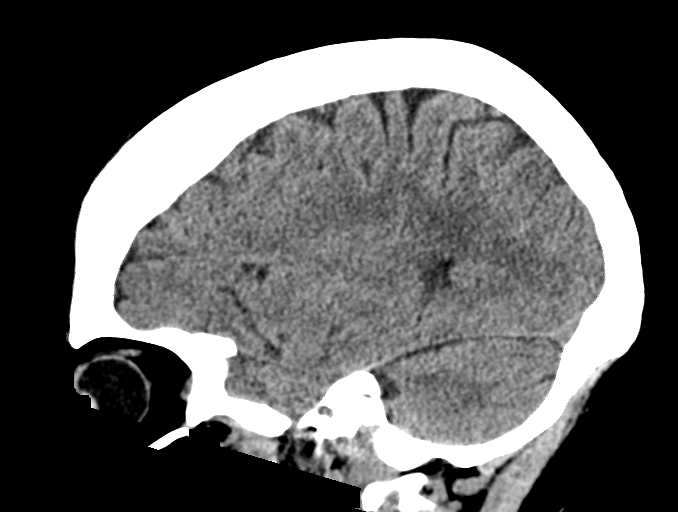

[15 of 47 positions shown; findings below may reference images not displayed]

FINDINGS: Brain: No evidence of acute infarction, hemorrhage, hydrocephalus,
extra-axial collection or mass lesion/mass effect.

Vascular: No hyperdense vessel or unexpected calcification.

Skull: Normal. Negative for fracture or focal lesion.

Sinuses/Orbits: No acute finding.

Other: None.
IMPRESSION: No acute intracranial abnormality noted.

## 2021-12-11 ENCOUNTER — Encounter (HOSPITAL_BASED_OUTPATIENT_CLINIC_OR_DEPARTMENT_OTHER): Payer: Self-pay | Admitting: Emergency Medicine

## 2021-12-11 ENCOUNTER — Emergency Department (HOSPITAL_BASED_OUTPATIENT_CLINIC_OR_DEPARTMENT_OTHER)
Admission: EM | Admit: 2021-12-11 | Discharge: 2021-12-11 | Disposition: A | Discharge status: Home / Self Care | Payer: Medicare HMO | Attending: Emergency Medicine | Admitting: Emergency Medicine

## 2021-12-11 ENCOUNTER — Other Ambulatory Visit: Payer: Self-pay

## 2021-12-11 DIAGNOSIS — R55 Syncope and collapse: Secondary | ICD-10-CM | POA: Insufficient documentation

## 2021-12-11 DIAGNOSIS — R42 Dizziness and giddiness: Secondary | ICD-10-CM | POA: Insufficient documentation

## 2021-12-11 DIAGNOSIS — R7309 Other abnormal glucose: Secondary | ICD-10-CM | POA: Insufficient documentation

## 2021-12-11 LAB — BASIC METABOLIC PANEL
Anion gap: 9 (ref 5–15)
BUN: 26 mg/dL — ABNORMAL HIGH (ref 8–23)
CO2: 25 mmol/L (ref 22–32)
Calcium: 9.5 mg/dL (ref 8.9–10.3)
Chloride: 105 mmol/L (ref 98–111)
Creatinine, Ser: 1.16 mg/dL — ABNORMAL HIGH (ref 0.44–1.00)
GFR, Estimated: 47 mL/min — ABNORMAL LOW (ref >60)
Glucose, Bld: 167 mg/dL — ABNORMAL HIGH (ref 70–99)
Potassium: 4.5 mmol/L (ref 3.5–5.1)
Sodium: 139 mmol/L (ref 135–145)

## 2021-12-11 LAB — CBC
HCT: 33.1 % — ABNORMAL LOW (ref 36.0–46.0)
Hemoglobin: 10.7 g/dL — ABNORMAL LOW (ref 12.0–15.0)
MCH: 28.5 pg (ref 26.0–34.0)
MCHC: 32.3 g/dL (ref 30.0–36.0)
MCV: 88.3 fL (ref 80.0–100.0)
Platelets: 252 10*3/uL (ref 150–400)
RBC: 3.75 MIL/uL — ABNORMAL LOW (ref 3.87–5.11)
RDW: 15.2 % (ref 11.5–15.5)
WBC: 9.3 10*3/uL (ref 4.0–10.5)
nRBC: 0 % (ref 0.0–0.2)

## 2021-12-11 LAB — CBG MONITORING, ED: Glucose-Capillary: 129 mg/dL — ABNORMAL HIGH (ref 70–99)

## 2021-12-11 MED ORDER — SODIUM CHLORIDE 0.9 % IV BOLUS
1000.0000 mL | Freq: Once | INTRAVENOUS | Status: AC
  Administered 2021-12-11: 1000 mL via INTRAVENOUS

## 2021-12-11 NOTE — Discharge Instructions (Signed)
Eat and drink as well as you can for the next 48 hours.  Please follow-up with your family doctor in the office.  Please return for worsening or persistent symptoms or if you develop chest pain trouble breathing headache or neck pain or abdominal discomfort. ?

## 2021-12-11 NOTE — ED Notes (Signed)
ED Provider at bedside. 

## 2021-12-11 NOTE — ED Provider Notes (Signed)
?[facility] EMERGENCY DEPARTMENT ?Provider Note ? ? ?CSN: [id number] ?Arrival date & time: 12/11/21  1648 ? ?  ? ?History ? ?Chief Complaint  ?Patient presents with  ? Near Syncope  ? ? ?Crystal Richard is a 86 y.o. female. ? ?86 yo F with a chief complaints of feeling like she might pass out.  This happened while she was waiting in line to check out at [facility].  She was leaning against her shopping carts and sat down on the floor.  She does not think that she fully went out.  Has been continuing to feel little bit lightheaded since then.  Was little bit lightheaded when she came into the room to try and stand up and get to the bed.  She tells me that she does not eat and drink as much as she thinks she should.  Had breakfast okay but has not had any lunch.  She denied any chest pain shortness of breath headache or neck pain.  Denies trauma with this event. ? ? ?Near Syncope ? ? ?  ? ?Home Medications ?Prior to Admission medications   ?Medication Sig Start Date End Date Taking? Authorizing Provider  ?acetaminophen (TYLENOL) 500 MG tablet Take 500 mg by mouth every 6 (six) hours as needed for headache (pain).    [provider]  ?losartan (COZAAR) 100 MG tablet Take 1 tablet (100 mg total) by mouth daily. 04/24/17   Florencia Reasons, MD  ?meclizine (ANTIVERT) 25 MG tablet Take 1 tablet (25 mg total) by mouth 3 (three) times daily as needed for dizziness or nausea. ?Patient not taking: Reported on 04/23/2017 02/21/16   Domenic Moras, PA-C  ?pravastatin (PRAVACHOL) 40 MG tablet Take 20 mg by mouth at bedtime. 01/27/17   [provider]  ?   ? ?Allergies    ?Patient has no known allergies.   ? ?Review of Systems   ?Review of Systems  ?Cardiovascular:  Positive for near-syncope.  ? ?Physical Exam ?Updated Vital Signs ?BP (!) 151/58   Pulse 65   Temp (!) 97.5 ?F (36.4 ?C) (Oral)   Resp 17   Ht 5\' 4"  (1.626 m)   Wt 59 kg   SpO2 100%   BMI 22.31 kg/m?  ?Physical Exam ?Vitals and nursing note reviewed.   ?Constitutional:   ?   General: She is not in acute distress. ?   Appearance: She is well-developed. She is not diaphoretic.  ?HENT:  ?   Head: Normocephalic and atraumatic.  ?Eyes:  ?   Pupils: Pupils are equal, round, and reactive to light.  ?Cardiovascular:  ?   Rate and Rhythm: Normal rate and regular rhythm.  ?   Heart sounds: No murmur heard. ?  No friction rub. No gallop.  ?Pulmonary:  ?   Effort: Pulmonary effort is normal.  ?   Breath sounds: No wheezing or rales.  ?Abdominal:  ?   General: There is no distension.  ?   Palpations: Abdomen is soft.  ?   Tenderness: There is no abdominal tenderness.  ?Musculoskeletal:     ?   General: No tenderness.  ?   Cervical back: Normal range of motion and neck supple.  ?Skin: ?   General: Skin is warm and dry.  ?Neurological:  ?   Mental Status: She is alert and oriented to person, place, and time.  ?Psychiatric:     ?   Behavior: Behavior normal.  ? ? ?ED Results / Procedures / Treatments   ?Labs ?(  all labs ordered are listed, but only abnormal results are displayed) ?Labs Reviewed  ?BASIC METABOLIC PANEL - Abnormal; Notable for the following components:  ?    Result Value  ? Glucose, Bld 167 (*)   ? BUN 26 (*)   ? Creatinine, Ser 1.16 (*)   ? GFR, Estimated 47 (*)   ? All other components within normal limits  ?CBC - Abnormal; Notable for the following components:  ? RBC 3.75 (*)   ? Hemoglobin 10.7 (*)   ? HCT 33.1 (*)   ? All other components within normal limits  ?CBG MONITORING, ED - Abnormal; Notable for the following components:  ? Glucose-Capillary 129 (*)   ? All other components within normal limits  ? ? ?EKG ?EKG Interpretation ? ?Date/Time:  Saturday December 11 2021 16:56:52 EDT ?Ventricular Rate:  56 ?PR Interval:  140 ?QRS Duration: 101 ?QT Interval:  438 ?QTC Calculation: 423 ?R Axis:   36 ?Text Interpretation: Sinus rhythm no wpw prolonged qt or brugada Otherwise no significant change Confirmed by Deno Etienne [id number]) on 12/11/2021 5:02:33  PM ? ?Radiology ?No results found. ? ?Procedures ?Procedures  ? ? ?Medications Ordered in ED ?Medications  ?sodium chloride 0.9 % bolus 1,000 mL (0 mLs Intravenous Stopped 12/11/21 1828)  ? ? ?ED Course/ Medical Decision Making/ A&P ?  ?                        ?Medical Decision Making ?Amount and/or Complexity of Data Reviewed ?Labs: ordered. ? ? ?86 yo F with a chief complaints of a near syncopal event.  This occurred while she was standing in line to check out at [facility].  Sounds vasovagal by history.  She also has not really anything to eat since breakfast.  We will give an oral trial here bolus of IV fluids blood work reassess. ? ?No significant electrolyte abnormality no significant anemia.  No leukocytosis.  Patient feeling much better after bolus of IV fluids.  Will discharge home.  PCP follow-up. ? ?6:29 PM:  I have discussed the diagnosis/risks/treatment options with the patient and family.  Evaluation and diagnostic testing in the emergency department does not suggest an emergent condition requiring admission or immediate intervention beyond what has been performed at this time.  They will follow up with  PCP. We also discussed returning to the ED immediately if new or worsening sx occur. We discussed the sx which are most concerning (e.g., sudden worsening pain, fever, inability to tolerate by mouth, recurrent event, chest pain, sob, abdominal pain) that necessitate immediate return. Medications administered to the patient during their visit and any new prescriptions provided to the patient are listed below. ? ?Medications given during this visit ?Medications  ?sodium chloride 0.9 % bolus 1,000 mL (0 mLs Intravenous Stopped 12/11/21 1828)  ? ? ? ?The patient appears reasonably screen and/or stabilized for discharge and I doubt any other medical condition or other [facility] requiring further screening, evaluation, or treatment in the ED at this time prior to discharge.  ? ? ? ? ? ? ? ? ?Final Clinical Impression(s)  / ED Diagnoses ?Final diagnoses:  ?Near syncope  ? ? ?Rx / DC Orders ?ED Discharge Orders   ? ? None  ? ?  ? ? ?  ?Deno Etienne, DO ?12/11/21 1829 ? ?

## 2021-12-11 NOTE — ED Triage Notes (Signed)
Pt arrives pov, to triage in wheelchair, c/o near syncope after feeling dizzy at grocery store pta. Pt denies HA, endorses leg weakness. Last well 1400 today. Pt drove to [employer]. Pt bilaterally equal, AOx4 ?

## 2022-11-25 ENCOUNTER — Other Ambulatory Visit: Payer: Self-pay

## 2022-11-25 ENCOUNTER — Emergency Department (HOSPITAL_BASED_OUTPATIENT_CLINIC_OR_DEPARTMENT_OTHER): Payer: Medicare HMO

## 2022-11-25 ENCOUNTER — Emergency Department (HOSPITAL_BASED_OUTPATIENT_CLINIC_OR_DEPARTMENT_OTHER)
Admission: EM | Admit: 2022-11-25 | Discharge: 2022-11-25 | Disposition: A | Discharge status: Home / Self Care | Payer: Medicare HMO | Attending: Emergency Medicine | Admitting: Emergency Medicine

## 2022-11-25 DIAGNOSIS — N3000 Acute cystitis without hematuria: Secondary | ICD-10-CM | POA: Insufficient documentation

## 2022-11-25 DIAGNOSIS — I10 Essential (primary) hypertension: Secondary | ICD-10-CM | POA: Insufficient documentation

## 2022-11-25 DIAGNOSIS — Z79899 Other long term (current) drug therapy: Secondary | ICD-10-CM | POA: Insufficient documentation

## 2022-11-25 DIAGNOSIS — R7989 Other specified abnormal findings of blood chemistry: Secondary | ICD-10-CM | POA: Insufficient documentation

## 2022-11-25 DIAGNOSIS — M19011 Primary osteoarthritis, right shoulder: Secondary | ICD-10-CM | POA: Diagnosis not present

## 2022-11-25 DIAGNOSIS — R42 Dizziness and giddiness: Secondary | ICD-10-CM | POA: Diagnosis present

## 2022-11-25 LAB — CBC WITH DIFFERENTIAL/PLATELET
Abs Immature Granulocytes: 0.02 10*3/uL (ref 0.00–0.07)
Basophils Absolute: 0 10*3/uL (ref 0.0–0.1)
Basophils Relative: 0 %
Eosinophils Absolute: 0 10*3/uL (ref 0.0–0.5)
Eosinophils Relative: 1 %
HCT: 33.7 % — ABNORMAL LOW (ref 36.0–46.0)
Hemoglobin: 10.8 g/dL — ABNORMAL LOW (ref 12.0–15.0)
Immature Granulocytes: 0 %
Lymphocytes Relative: 10 %
Lymphs Abs: 0.6 10*3/uL — ABNORMAL LOW (ref 0.7–4.0)
MCH: 27.7 pg (ref 26.0–34.0)
MCHC: 32 g/dL (ref 30.0–36.0)
MCV: 86.4 fL (ref 80.0–100.0)
Monocytes Absolute: 0.3 10*3/uL (ref 0.1–1.0)
Monocytes Relative: 5 %
Neutro Abs: 4.8 10*3/uL (ref 1.7–7.7)
Neutrophils Relative %: 84 %
Platelets: 241 10*3/uL (ref 150–400)
RBC: 3.9 MIL/uL (ref 3.87–5.11)
RDW: 14.6 % (ref 11.5–15.5)
WBC: 5.7 10*3/uL (ref 4.0–10.5)
nRBC: 0 % (ref 0.0–0.2)

## 2022-11-25 LAB — BASIC METABOLIC PANEL
Anion gap: 7 (ref 5–15)
BUN: 16 mg/dL (ref 8–23)
CO2: 24 mmol/L (ref 22–32)
Calcium: 9.3 mg/dL (ref 8.9–10.3)
Chloride: 107 mmol/L (ref 98–111)
Creatinine, Ser: 1.08 mg/dL — ABNORMAL HIGH (ref 0.44–1.00)
GFR, Estimated: 51 mL/min — ABNORMAL LOW (ref >60)
Glucose, Bld: 118 mg/dL — ABNORMAL HIGH (ref 70–99)
Potassium: 3.8 mmol/L (ref 3.5–5.1)
Sodium: 138 mmol/L (ref 135–145)

## 2022-11-25 LAB — URINALYSIS, ROUTINE W REFLEX MICROSCOPIC
Bilirubin Urine: NEGATIVE
Glucose, UA: NEGATIVE mg/dL
Hgb urine dipstick: NEGATIVE
Ketones, ur: NEGATIVE mg/dL
Nitrite: NEGATIVE
Protein, ur: NEGATIVE mg/dL
Specific Gravity, Urine: 1.01 (ref 1.005–1.030)
pH: 5.5 (ref 5.0–8.0)

## 2022-11-25 LAB — TROPONIN I (HIGH SENSITIVITY)
Troponin I (High Sensitivity): 3 ng/L (ref <18)
Troponin I (High Sensitivity): 4 ng/L (ref <18)

## 2022-11-25 LAB — URINALYSIS, MICROSCOPIC (REFLEX)

## 2022-11-25 LAB — MAGNESIUM: Magnesium: 2.4 mg/dL (ref 1.7–2.4)

## 2022-11-25 LAB — CBG MONITORING, ED: Glucose-Capillary: 94 mg/dL (ref 70–99)

## 2022-11-25 MED ORDER — LIDOCAINE 5 % EX PTCH
1.0000 | MEDICATED_PATCH | Freq: Once | CUTANEOUS | Status: DC
  Administered 2022-11-25: 1 via TRANSDERMAL
  Filled 2022-11-25: qty 1

## 2022-11-25 MED ORDER — CEPHALEXIN 500 MG PO CAPS: 500.0000 mg | ORAL_CAPSULE | Freq: Four times a day (QID) | ORAL | 0 refills | Status: AC

## 2022-11-25 MED ORDER — SODIUM CHLORIDE 0.9 % IV BOLUS
500.0000 mL | Freq: Once | INTRAVENOUS | Status: AC
  Administered 2022-11-25: 500 mL via INTRAVENOUS

## 2022-11-25 MED ORDER — ACETAMINOPHEN 325 MG PO TABS
650.0000 mg | ORAL_TABLET | Freq: Once | ORAL | Status: AC
  Administered 2022-11-25: 650 mg via ORAL
  Filled 2022-11-25: qty 2

## 2022-11-25 NOTE — ED Triage Notes (Signed)
Patient presents to ED via POV from urgent care. Here with intermittent dizziness x 1 year. This episode began this morning. Patient reports near syncope. GCS 15.

## 2022-11-25 NOTE — ED Provider Notes (Signed)
[facility] Provider Note   CSN: [id number] Arrival date & time: 11/25/22  1236     History  Chief Complaint  Patient presents with   Dizziness    Crystal Richard is a 87 y.o. female with a PMHx of HTN, hypercholesteremia, who presents emergency department with concerns for intermittent dizziness x 1 year.  Notes another episode of dizziness started this morning while she was sitting up in the bed and when she went to stand.  Notes that she felt lightheaded at the time. Patient has associated near syncope and right shoulder pain.  No meds prior to arrival.  Denies recent fall, injury, trauma, heavy lifting.  Denies chest pain, shortness of breath, abdominal pain, nausea, vomiting, urinary symptoms, numbness, tingling, weakness.  Denies anticoagulant use at this time.  Per pt chart review: Patient was evaluated at urgent care earlier today and told to come to the emergency department for further evaluation of her symptoms.  The history is provided by the patient. No language interpreter was used.       Home Medications Prior to Admission medications   Medication Sig Start Date End Date Taking? Authorizing Provider  amLODipine (NORVASC) 10 MG tablet Take 10 mg by mouth. 06/02/22  Yes [provider]  cephALEXin (KEFLEX) 500 MG capsule Take 1 capsule (500 mg total) by mouth 4 (four) times daily for 7 days. 11/25/22 12/02/22 Yes Harlean Regula A, PA-C  acetaminophen (TYLENOL) 500 MG tablet Take 500 mg by mouth every 6 (six) hours as needed for headache (pain).    [provider]  losartan (COZAAR) 100 MG tablet Take 1 tablet (100 mg total) by mouth daily. 04/24/17   Florencia Reasons, MD  meclizine (ANTIVERT) 25 MG tablet Take 1 tablet (25 mg total) by mouth 3 (three) times daily as needed for dizziness or nausea. Patient not taking: Reported on 04/23/2017 02/21/16   Domenic Moras, PA-C  pravastatin (PRAVACHOL) 40 MG tablet Take 20 mg by mouth  at bedtime. 01/27/17   [provider]      Allergies    Patient has no known allergies.    Review of Systems   Review of Systems  Neurological:  Positive for dizziness.  All other systems reviewed and are negative.   Physical Exam Updated Vital Signs BP (!) 153/94   Pulse 73   Temp 98.3 F (36.8 C) (Oral)   Resp 20   SpO2 100%  Physical Exam Vitals and nursing note reviewed.  Constitutional:      General: She is not in acute distress.    Appearance: She is not diaphoretic.  HENT:     Head: Normocephalic and atraumatic.     Mouth/Throat:     Pharynx: No oropharyngeal exudate.  Eyes:     General: No scleral icterus.    Conjunctiva/sclera: Conjunctivae normal.  Cardiovascular:     Rate and Rhythm: Normal rate and regular rhythm.     Pulses: Normal pulses.     Heart sounds: Normal heart sounds.  Pulmonary:     Effort: Pulmonary effort is normal. No respiratory distress.     Breath sounds: Normal breath sounds. No wheezing.  Abdominal:     General: Bowel sounds are normal.     Palpations: Abdomen is soft. There is no mass.     Tenderness: There is no abdominal tenderness. There is no guarding or rebound.  Musculoskeletal:        General: Normal range of motion.  Cervical back: Normal range of motion and neck supple.     Comments: No spinal TTP. TTP noted to right trapezius. No TTP noted to right anterior shoulder or biceps tendon. Full ROM of right shoulder without difficulty. No pitting edema noted bilaterally.   Skin:    General: Skin is warm and dry.  Neurological:     Mental Status: She is alert.     Comments: No focal neurological deficits. Negative pronator drift. Able to ambulate without assistance or difficulty. Strength and sensation intact to BUE and BLE. Grip strength 5/5 bilaterally.  Normal finger-nose testing.  Normal heel-to-shin testing.  Cranial nerves II through XII intact.  Psychiatric:        Behavior: Behavior normal.     ED Results  / Procedures / Treatments   Labs (all labs ordered are listed, but only abnormal results are displayed) Labs Reviewed  BASIC METABOLIC PANEL - Abnormal; Notable for the following components:      Result Value   Glucose, Bld 118 (*)    Creatinine, Ser 1.08 (*)    GFR, Estimated 51 (*)    All other components within normal limits  CBC WITH DIFFERENTIAL/PLATELET - Abnormal; Notable for the following components:   Hemoglobin 10.8 (*)    HCT 33.7 (*)    Lymphs Abs 0.6 (*)    All other components within normal limits  URINALYSIS, ROUTINE W REFLEX MICROSCOPIC - Abnormal; Notable for the following components:   Leukocytes,Ua SMALL (*)    All other components within normal limits  URINALYSIS, MICROSCOPIC (REFLEX) - Abnormal; Notable for the following components:   Bacteria, UA MANY (*)    All other components within normal limits  URINE CULTURE  MAGNESIUM  CBG MONITORING, ED  TROPONIN I (HIGH SENSITIVITY)  TROPONIN I (HIGH SENSITIVITY)    EKG EKG Interpretation  Date/Time:  Friday November 25 2022 12:52:09 EDT Ventricular Rate:  69 PR Interval:  142 QRS Duration: 101 QT Interval:  407 QTC Calculation: 436 R Axis:   51 Text Interpretation: Sinus rhythm Ventricular premature complex , new since last tracing Right atrial enlargement Confirmed by Dorie Rank [phone]) on 11/25/2022 12:58:11 PM  Radiology CT HEAD WO CONTRAST  Result Date: 11/25/2022 CLINICAL DATA:  Syncope EXAM: CT HEAD WITHOUT CONTRAST TECHNIQUE: Contiguous axial images were obtained from the base of the skull through the vertex without intravenous contrast. RADIATION DOSE REDUCTION: This exam was performed according to the departmental dose-optimization program which includes automated exposure control, adjustment of the mA and/or kV according to patient size and/or use of iterative reconstruction technique. COMPARISON:  None Available. FINDINGS: Brain: No acute intracranial hemorrhage. No focal mass lesion. No CT evidence of  acute infarction. No midline shift or mass effect. No hydrocephalus. Basilar cisterns are patent. There are periventricular and subcortical white matter hypodensities. Generalized cortical atrophy. Vascular: No hyperdense vessel or unexpected calcification. Skull: Normal. Negative for fracture or focal lesion. Sinuses/Orbits: Paranasal sinuses and mastoid air cells are clear. Orbits are clear. Other: None. IMPRESSION: 1. No acute intracranial findings. 2. Atrophy and white matter microvascular disease. Electronically Signed   By: Suzy Bouchard M.D.   On: 11/25/2022 13:39   DG Shoulder Right  Result Date: 11/25/2022 CLINICAL DATA:  Near-syncope.  Right shoulder pain.  Chronic. EXAM: RIGHT SHOULDER - 2+ VIEW COMPARISON:  None Available. FINDINGS: Moderate to severe glenohumeral joint space narrowing and peripheral osteophytosis. Mild glenoid fossa concave degenerative cortical remodeling. Moderate acromioclavicular joint space narrowing with mild-to-moderate inferior osteophytosis. No acute  fracture or dislocation. The visualized portion of the right lung is unremarkable. Moderate to high-grade atherosclerotic calcifications within the aortic arch. IMPRESSION: 1. Moderate to severe glenohumeral osteoarthritis. 2. Moderate acromioclavicular osteoarthritis. Electronically Signed   By: Yvonne Kendall M.D.   On: 11/25/2022 13:35    Procedures Procedures    Medications Ordered in ED Medications  lidocaine (LIDODERM) 5 % 1 patch (1 patch Transdermal Patch Applied 11/25/22 1347)  acetaminophen (TYLENOL) tablet 650 mg (650 mg Oral Given 11/25/22 1347)  sodium chloride 0.9 % bolus 500 mL ( Intravenous Stopped 11/25/22 1454)    ED Course/ Medical Decision Making/ A&P Clinical Course as of 11/25/22 1637  Fri Nov 25, 2022  1442 Reevaluated and resting comfortably on stretcher. Pt has been ambulating to and from the bathroom without difficulty. Discussed with patient lab/imaging findings. Answered all available  questions.  [SB]    Clinical Course User Index [SB] Beonca Gibb A, PA-C                             Medical Decision Making Amount and/or Complexity of Data Reviewed Labs: ordered. Radiology: ordered.  Risk OTC drugs. Prescription drug management.   Pt presents with concerns for positional dizziness onset last night. Has had similar symptoms x 1 year. Vital signs, pt afebrile. On exam, pt with no focal neurological deficits. No acute cardiovascular, respiratory, abdominal exam findings. Differential diagnosis includes hypoglycemia, arrhythmia, electrolyte abnormality, intracranial abnormality, vertigo.    Co morbidities that complicate the patient evaluation: HTN, hypercholesteremia  Additional history obtained:  Additional history obtained from Daughter/Son External records from outside source obtained and reviewed including: Pt was evaluated at UC earlier today and sent to the ED for further evaluation of her symptoms.   Labs:  I ordered, and personally interpreted labs.  The pertinent results include:   CBC without leukocytosis Initial troponin 3 delta troponin 4 BMP with slightly elevated creatinine at 1.08 and GFR decreased at 51 Magnesium unremarkable at 2.4 Urinalysis with small amount of leukocytes and many bacteria, nitrite negative  Imaging: I ordered imaging studies including  right shoulder xray and  X-ray CT head without I independently visualized and interpreted imaging which showed: CT head with  1. No acute intracranial findings.  2. Atrophy and white matter microvascular disease.   Right shoulder xray with  1. Moderate to severe glenohumeral osteoarthritis.  2. Moderate acromioclavicular osteoarthritis.    I agree with the radiologist interpretation  Medications:  I ordered medication including IVF, tylenol, lidoderm patch, warm compress for symptom management Reevaluation of the patient after these medicines and interventions, I reevaluated the  patient and found that they have improved I have reviewed the patients home medicines and have made adjustments as needed   Disposition: Presentation suspicious for concerns for dizziness.  It was notable for osteoarthritis of the right shoulder.  Doubt fracture, dislocation at this time. Noted acute cystitis without hematuria.  Urine culture sent.  Patient ambulatory in the emergency department multiple times.. Doubt hypoglycemia, arrhythmia, electrolyte abnormality, or intracranial abnormality. Doubt concerns for vertigo at this time. After consideration of the diagnostic results and the patients response to treatment, I feel that the patient would benefit from Discharge home.  Keflex sent to patient's pharmacy.  Instructed patient to maintain follow up annual exam on 11/29/22. Supportive care measures and strict return precautions discussed with patient at bedside. Pt acknowledges and verbalizes understanding. Pt appears safe for discharge. Follow up as  indicated in discharge paperwork.    This chart was dictated using voice recognition software, Dragon. Despite the best efforts of this provider to proofread and correct errors, errors may still occur which can change documentation meaning.  Final Clinical Impression(s) / ED Diagnoses Final diagnoses:  Dizziness  Acute cystitis without hematuria  Osteoarthritis of right shoulder, unspecified osteoarthritis type    Rx / DC Orders ED Discharge Orders          Ordered    cephALEXin (KEFLEX) 500 MG capsule  4 times daily        11/25/22 1634              Lamia Mariner A, PA-C 11/25/22 1640    Dorie Rank, MD 11/26/22 [phone]

## 2022-11-25 NOTE — ED Notes (Signed)
ED Provider at bedside. 

## 2022-11-25 NOTE — Discharge Instructions (Addendum)
It was a pleasure take care of you today!  Your labs did not show any concerning emergent findings at this time.  His CT of your head did not show any emergent findings.  Your x-ray of your shoulder showed osteoarthritis.  This can be further managed with your primary care provider.  You have been sent a prescription for Keflex to treat your urine, take as directed.  A urine culture has been sent today.  You may also take over-the-counter 600 mg ibuprofen every 6 hours and alternate with 500 mg Tylenol every 6 hours as needed for pain for no more than 7 days.  Return to the emergency department if you are experiencing increasing/worsening symptoms.

## 2022-11-25 NOTE — ED Notes (Signed)
Pt ambulated with standby assist to restroom. Pt reports mild dizziness when ambulating. Pt informed to use call bell when complete

## 2022-11-27 LAB — URINE CULTURE: Culture: <10000 — AB

## 2022-11-29 DIAGNOSIS — F4323 Adjustment disorder with mixed anxiety and depressed mood: Secondary | ICD-10-CM | POA: Insufficient documentation

## 2023-05-29 DIAGNOSIS — R42 Dizziness and giddiness: Secondary | ICD-10-CM | POA: Insufficient documentation

## 2024-01-25 ENCOUNTER — Encounter (HOSPITAL_BASED_OUTPATIENT_CLINIC_OR_DEPARTMENT_OTHER): Payer: Self-pay | Admitting: Emergency Medicine

## 2024-01-25 ENCOUNTER — Emergency Department (HOSPITAL_BASED_OUTPATIENT_CLINIC_OR_DEPARTMENT_OTHER)

## 2024-01-25 ENCOUNTER — Inpatient Hospital Stay (HOSPITAL_BASED_OUTPATIENT_CLINIC_OR_DEPARTMENT_OTHER)
Admission: EM | Admit: 2024-01-25 | Discharge: 2024-01-27 | DRG: 419 | Disposition: A | Discharge status: Home / Self Care | Attending: Internal Medicine | Admitting: Internal Medicine

## 2024-01-25 ENCOUNTER — Inpatient Hospital Stay (HOSPITAL_COMMUNITY)

## 2024-01-25 ENCOUNTER — Other Ambulatory Visit: Payer: Self-pay

## 2024-01-25 DIAGNOSIS — K8012 Calculus of gallbladder with acute and chronic cholecystitis without obstruction: Principal | ICD-10-CM | POA: Diagnosis present

## 2024-01-25 DIAGNOSIS — Z96643 Presence of artificial hip joint, bilateral: Secondary | ICD-10-CM | POA: Diagnosis present

## 2024-01-25 DIAGNOSIS — I5189 Other ill-defined heart diseases: Secondary | ICD-10-CM

## 2024-01-25 DIAGNOSIS — Z87891 Personal history of nicotine dependence: Secondary | ICD-10-CM | POA: Diagnosis not present

## 2024-01-25 DIAGNOSIS — F4323 Adjustment disorder with mixed anxiety and depressed mood: Secondary | ICD-10-CM | POA: Diagnosis present

## 2024-01-25 DIAGNOSIS — E876 Hypokalemia: Secondary | ICD-10-CM | POA: Diagnosis present

## 2024-01-25 DIAGNOSIS — E1122 Type 2 diabetes mellitus with diabetic chronic kidney disease: Secondary | ICD-10-CM | POA: Diagnosis present

## 2024-01-25 DIAGNOSIS — E538 Deficiency of other specified B group vitamins: Secondary | ICD-10-CM | POA: Diagnosis present

## 2024-01-25 DIAGNOSIS — E1165 Type 2 diabetes mellitus with hyperglycemia: Secondary | ICD-10-CM | POA: Diagnosis present

## 2024-01-25 DIAGNOSIS — K810 Acute cholecystitis: Secondary | ICD-10-CM | POA: Diagnosis not present

## 2024-01-25 DIAGNOSIS — Z79899 Other long term (current) drug therapy: Secondary | ICD-10-CM | POA: Diagnosis not present

## 2024-01-25 DIAGNOSIS — I129 Hypertensive chronic kidney disease with stage 1 through stage 4 chronic kidney disease, or unspecified chronic kidney disease: Secondary | ICD-10-CM | POA: Diagnosis present

## 2024-01-25 DIAGNOSIS — K5900 Constipation, unspecified: Secondary | ICD-10-CM | POA: Diagnosis present

## 2024-01-25 DIAGNOSIS — R0609 Other forms of dyspnea: Secondary | ICD-10-CM | POA: Diagnosis not present

## 2024-01-25 DIAGNOSIS — Z01810 Encounter for preprocedural cardiovascular examination: Secondary | ICD-10-CM

## 2024-01-25 DIAGNOSIS — M353 Polymyalgia rheumatica: Secondary | ICD-10-CM | POA: Diagnosis present

## 2024-01-25 DIAGNOSIS — R9431 Abnormal electrocardiogram [ECG] [EKG]: Secondary | ICD-10-CM | POA: Diagnosis not present

## 2024-01-25 DIAGNOSIS — I10 Essential (primary) hypertension: Secondary | ICD-10-CM | POA: Diagnosis not present

## 2024-01-25 DIAGNOSIS — E7800 Pure hypercholesterolemia, unspecified: Secondary | ICD-10-CM | POA: Diagnosis present

## 2024-01-25 DIAGNOSIS — K828 Other specified diseases of gallbladder: Secondary | ICD-10-CM | POA: Diagnosis present

## 2024-01-25 DIAGNOSIS — D631 Anemia in chronic kidney disease: Secondary | ICD-10-CM | POA: Diagnosis present

## 2024-01-25 DIAGNOSIS — D649 Anemia, unspecified: Secondary | ICD-10-CM | POA: Diagnosis present

## 2024-01-25 DIAGNOSIS — N1831 Chronic kidney disease, stage 3a: Secondary | ICD-10-CM | POA: Diagnosis present

## 2024-01-25 DIAGNOSIS — R509 Fever, unspecified: Secondary | ICD-10-CM | POA: Diagnosis present

## 2024-01-25 DIAGNOSIS — I272 Pulmonary hypertension, unspecified: Secondary | ICD-10-CM | POA: Diagnosis present

## 2024-01-25 LAB — URINALYSIS, MICROSCOPIC (REFLEX)

## 2024-01-25 LAB — COMPREHENSIVE METABOLIC PANEL WITH GFR
ALT: 76 U/L — ABNORMAL HIGH (ref 0–44)
AST: 181 U/L — ABNORMAL HIGH (ref 15–41)
Albumin: 4.3 g/dL (ref 3.5–5.0)
Alkaline Phosphatase: 104 U/L (ref 38–126)
Anion gap: 13 (ref 5–15)
BUN: 15 mg/dL (ref 8–23)
CO2: 24 mmol/L (ref 22–32)
Calcium: 9.6 mg/dL (ref 8.9–10.3)
Chloride: 103 mmol/L (ref 98–111)
Creatinine, Ser: 0.89 mg/dL (ref 0.44–1.00)
GFR, Estimated: >60 mL/min (ref >60)
Glucose, Bld: 209 mg/dL — ABNORMAL HIGH (ref 70–99)
Potassium: 3.3 mmol/L — ABNORMAL LOW (ref 3.5–5.1)
Sodium: 140 mmol/L (ref 135–145)
Total Bilirubin: 0.8 mg/dL (ref 0.0–1.2)
Total Protein: 7.4 g/dL (ref 6.5–8.1)

## 2024-01-25 LAB — ECHOCARDIOGRAM COMPLETE
AR max vel: 2.14 cm2
AV Area VTI: 1.78 cm2
AV Area mean vel: 1.66 cm2
AV Mean grad: 6 mmHg
AV Peak grad: 10.5 mmHg
Ao pk vel: 1.62 m/s
Area-P 1/2: 3.81 cm2
Calc EF: 68.7 %
Height: 64 in
MV VTI: 2.06 cm2
P 1/2 time: 553 ms
S' Lateral: 2.5 cm
Single Plane A2C EF: 65.7 %
Single Plane A4C EF: 70.6 %
Weight: 1760 [oz_av]

## 2024-01-25 LAB — CBC WITH DIFFERENTIAL/PLATELET
Abs Immature Granulocytes: 0.01 10*3/uL (ref 0.00–0.07)
Basophils Absolute: 0 10*3/uL (ref 0.0–0.1)
Basophils Relative: 0 %
Eosinophils Absolute: 0.1 10*3/uL (ref 0.0–0.5)
Eosinophils Relative: 1 %
HCT: 32.9 % — ABNORMAL LOW (ref 36.0–46.0)
Hemoglobin: 10.6 g/dL — ABNORMAL LOW (ref 12.0–15.0)
Immature Granulocytes: 0 %
Lymphocytes Relative: 8 %
Lymphs Abs: 0.7 10*3/uL (ref 0.7–4.0)
MCH: 28.4 pg (ref 26.0–34.0)
MCHC: 32.2 g/dL (ref 30.0–36.0)
MCV: 88.2 fL (ref 80.0–100.0)
Monocytes Absolute: 0.1 10*3/uL (ref 0.1–1.0)
Monocytes Relative: 1 %
Neutro Abs: 7.6 10*3/uL (ref 1.7–7.7)
Neutrophils Relative %: 90 %
Platelets: 222 10*3/uL (ref 150–400)
RBC: 3.73 MIL/uL — ABNORMAL LOW (ref 3.87–5.11)
RDW: 14.6 % (ref 11.5–15.5)
WBC: 8.5 10*3/uL (ref 4.0–10.5)
nRBC: 0 % (ref 0.0–0.2)

## 2024-01-25 LAB — URINALYSIS, ROUTINE W REFLEX MICROSCOPIC
Bilirubin Urine: NEGATIVE
Glucose, UA: NEGATIVE mg/dL
Ketones, ur: NEGATIVE mg/dL
Leukocytes,Ua: NEGATIVE
Nitrite: NEGATIVE
Protein, ur: NEGATIVE mg/dL
Specific Gravity, Urine: 1.015 (ref 1.005–1.030)
pH: 6 (ref 5.0–8.0)

## 2024-01-25 LAB — MAGNESIUM: Magnesium: 2 mg/dL (ref 1.7–2.4)

## 2024-01-25 LAB — PHOSPHORUS: Phosphorus: 3 mg/dL (ref 2.5–4.6)

## 2024-01-25 LAB — TROPONIN T, HIGH SENSITIVITY
Troponin T High Sensitivity: <15 ng/L (ref <19)
Troponin T High Sensitivity: <15 ng/L (ref <19)

## 2024-01-25 LAB — LIPASE, BLOOD: Lipase: 55 U/L — ABNORMAL HIGH (ref 11–51)

## 2024-01-25 LAB — GLUCOSE, CAPILLARY
Glucose-Capillary: 103 mg/dL — ABNORMAL HIGH (ref 70–99)
Glucose-Capillary: 120 mg/dL — ABNORMAL HIGH (ref 70–99)
Glucose-Capillary: 127 mg/dL — ABNORMAL HIGH (ref 70–99)
Glucose-Capillary: 136 mg/dL — ABNORMAL HIGH (ref 70–99)

## 2024-01-25 MED ORDER — POTASSIUM CHLORIDE 10 MEQ/100ML IV SOLN
10.0000 meq | INTRAVENOUS | Status: AC
  Administered 2024-01-25 (×2): 10 meq via INTRAVENOUS
  Filled 2024-01-25 (×2): qty 100

## 2024-01-25 MED ORDER — FENTANYL CITRATE PF 50 MCG/ML IJ SOSY
50.0000 ug | PREFILLED_SYRINGE | Freq: Once | INTRAMUSCULAR | Status: AC
  Administered 2024-01-25: 50 ug via INTRAVENOUS
  Filled 2024-01-25: qty 1

## 2024-01-25 MED ORDER — METOCLOPRAMIDE HCL 5 MG/ML IJ SOLN
10.0000 mg | Freq: Once | INTRAMUSCULAR | Status: AC
  Administered 2024-01-25: 10 mg via INTRAVENOUS
  Filled 2024-01-25: qty 2

## 2024-01-25 MED ORDER — MAGNESIUM SULFATE 2 GM/50ML IV SOLN
2.0000 g | Freq: Once | INTRAVENOUS | Status: AC
  Administered 2024-01-25: 2 g via INTRAVENOUS
  Filled 2024-01-25: qty 50

## 2024-01-25 MED ORDER — ACETAMINOPHEN 325 MG PO TABS
650.0000 mg | ORAL_TABLET | Freq: Four times a day (QID) | ORAL | Status: DC | PRN
  Administered 2024-01-25 – 2024-01-26 (×2): 650 mg via ORAL
  Filled 2024-01-25 (×2): qty 2

## 2024-01-25 MED ORDER — POTASSIUM CHLORIDE 10 MEQ/100ML IV SOLN
10.0000 meq | Freq: Once | INTRAVENOUS | Status: AC
  Administered 2024-01-25: 10 meq via INTRAVENOUS
  Filled 2024-01-25: qty 100

## 2024-01-25 MED ORDER — PANTOPRAZOLE SODIUM 40 MG IV SOLR
40.0000 mg | Freq: Once | INTRAVENOUS | Status: AC
  Administered 2024-01-25: 40 mg via INTRAVENOUS
  Filled 2024-01-25: qty 10

## 2024-01-25 MED ORDER — SODIUM CHLORIDE 0.9 % IV SOLN
2.0000 g | INTRAVENOUS | Status: DC
  Administered 2024-01-26 – 2024-01-27 (×2): 2 g via INTRAVENOUS
  Filled 2024-01-25 (×2): qty 20

## 2024-01-25 MED ORDER — METRONIDAZOLE 500 MG/100ML IV SOLN
500.0000 mg | Freq: Two times a day (BID) | INTRAVENOUS | Status: DC
  Administered 2024-01-25 – 2024-01-27 (×5): 500 mg via INTRAVENOUS
  Filled 2024-01-25 (×5): qty 100

## 2024-01-25 MED ORDER — HYDRALAZINE HCL 20 MG/ML IJ SOLN
10.0000 mg | INTRAMUSCULAR | Status: DC | PRN
  Administered 2024-01-26 (×2): 10 mg via INTRAVENOUS
  Filled 2024-01-25 (×2): qty 1

## 2024-01-25 MED ORDER — LOSARTAN POTASSIUM 25 MG PO TABS
25.0000 mg | ORAL_TABLET | Freq: Every day | ORAL | Status: DC
  Administered 2024-01-25 – 2024-01-27 (×3): 25 mg via ORAL
  Filled 2024-01-25 (×3): qty 1

## 2024-01-25 MED ORDER — SODIUM CHLORIDE 0.9 % IV SOLN
2.0000 g | Freq: Once | INTRAVENOUS | Status: AC
  Administered 2024-01-25: 2 g via INTRAVENOUS
  Filled 2024-01-25: qty 20

## 2024-01-25 MED ORDER — ONDANSETRON HCL 4 MG/2ML IJ SOLN: 4.0000 mg | Freq: Four times a day (QID) | INTRAMUSCULAR | Status: DC | PRN

## 2024-01-25 MED ORDER — SODIUM CHLORIDE 0.9 % IV BOLUS
500.0000 mL | Freq: Once | INTRAVENOUS | Status: AC
  Administered 2024-01-25: 500 mL via INTRAVENOUS

## 2024-01-25 MED ORDER — ONDANSETRON HCL 4 MG/2ML IJ SOLN
4.0000 mg | Freq: Once | INTRAMUSCULAR | Status: AC
  Administered 2024-01-25: 4 mg via INTRAVENOUS
  Filled 2024-01-25: qty 2

## 2024-01-25 MED ORDER — POTASSIUM CHLORIDE IN NACL 20-0.45 MEQ/L-% IV SOLN
INTRAVENOUS | Status: DC
  Filled 2024-01-25: qty 1000

## 2024-01-25 MED ORDER — ORAL CARE MOUTH RINSE: 15.0000 mL | OROMUCOSAL | Status: DC | PRN

## 2024-01-25 MED ORDER — IOHEXOL 300 MG/ML  SOLN
100.0000 mL | Freq: Once | INTRAMUSCULAR | Status: AC | PRN
  Administered 2024-01-25: 100 mL via INTRAVENOUS

## 2024-01-25 MED ORDER — FENTANYL CITRATE PF 50 MCG/ML IJ SOSY
50.0000 ug | PREFILLED_SYRINGE | INTRAMUSCULAR | Status: DC | PRN
  Administered 2024-01-26: 50 ug via INTRAVENOUS
  Filled 2024-01-25: qty 1

## 2024-01-25 MED ORDER — ACETAMINOPHEN 650 MG RE SUPP: 650.0000 mg | Freq: Four times a day (QID) | RECTAL | Status: DC | PRN

## 2024-01-25 MED ORDER — ONDANSETRON HCL 4 MG PO TABS: 4.0000 mg | ORAL_TABLET | Freq: Four times a day (QID) | ORAL | Status: DC | PRN

## 2024-01-25 NOTE — H&P (Signed)
 History and Physical    Patient: Crystal Richard ZOX:[id number] DOB: [birth year] DOA: 01/25/2024 DOS: the patient was seen and examined on 01/25/2024 PCP: Lieutenant Reese., MD  Patient coming from: Home  Chief Complaint:  Chief Complaint  Patient presents with   Abdominal Pain   HPI: Crystal Richard is a 88 y.o. female with medical history significant of hyperlipidemia, grade 1 diastolic dysfunction, hypertension, urinary incontinence, stage 3a CKD, postural dizziness and presyncope, arthralgias, osteoarthritis, osteoporosis, adjustment disorder with mixed anxiety and depressed mood, vitamin B12 deficiency who presented to the emergency department with complaints of right upper quadrant abdominal and right flank pain associated with fever.  No diarrhea, constipation, melena or hematochezia.  No dysuria, frequency or hematuria.  She has had episodes of dizziness and diaphoresis when going to the store and having to stand for more than 30 minutes.  No chest pain, palpitations, nausea or emesis associated with this.  No recent lower extremity edema, PND or orthopnea.  Lab work: Urinalysis showed trace hemoglobin and rare bacteria microscopic examination.  CBC showed a white count of 8.5, hemoglobin 10.6 g/dL platelets 811.  Troponin x 2 normal.  Magnesium is 2.0 and phosphorus 3.0 mg/dL.  Potassium is 3.3 mmol/L, glucose 209 mg/dL, lipase was 55, AST 914 and ALT 76 units/L.  Renal function, the rest of the electrolytes and the rest of the LFTs were normal.  Imaging: CT abdomen/pelvis with contrast showing a normal appendix.  No acute or inflammatory process identified in the abdomen, but there is retained stool throughout the resume the large bowel, consider constipation.  Bilateral hip arthroplasty artifact obscures most of the visualization of the pelvis.  Severe aortic atherosclerosis.  RUQ ultrasound showed distended gallbladder with small stones and/or Oxaydo sludge.  Wall thickness at the upper  limits of normal, but absent sonographic Murphy's sign Rx against acute cholecystitis.  Negative ultrasound appearance of the liver and bile ducts.   ED course: Initial vital signs were temperature 98 F, pulse 67, respiration 22, BP 207/75 mmHg O2 sat 98% on room air.  The patient received ceftriaxone 2 g IVPB, fentanyl 50 mcg IVP, ondansetron 4 mg IVP x 2, metoclopramide 10 mg IVP, KCl 10 mill equivalents IVPB and normal saline 500 mL bolus.  Review of Systems: As mentioned in the history of present illness. All other systems reviewed and are negative.  Past Medical History:  Diagnosis Date   Hypercholesteremia    Hypertension    Past Surgical History:  Procedure Laterality Date   JOINT REPLACEMENT     bil hips   TOTAL HIP ARTHROPLASTY     Social History:  reports that she has quit smoking. She has never used smokeless tobacco. She reports that she does not drink alcohol and does not use drugs.  No Known Allergies  History reviewed. No pertinent family history.  Prior to Admission medications   Medication Sig Start Date End Date Taking? Authorizing Provider  acetaminophen  (TYLENOL ) 500 MG tablet Take 500 mg by mouth every 6 (six) hours as needed for headache (pain).    [provider]  amLODipine (NORVASC) 10 MG tablet Take 10 mg by mouth. 06/02/22   [provider]  losartan  (COZAAR ) 100 MG tablet Take 1 tablet (100 mg total) by mouth daily. 04/24/17   Lavaughn Portland, MD  meclizine  (ANTIVERT ) 25 MG tablet Take 1 tablet (25 mg total) by mouth 3 (three) times daily as needed for dizziness or nausea. Patient not taking: Reported on 04/23/2017 02/21/16  Debbra Fairy, PA-C  pravastatin  (PRAVACHOL ) 40 MG tablet Take 20 mg by mouth at bedtime. 01/27/17   [provider]    Physical Exam: Vitals:   01/25/24 0730 01/25/24 0800 01/25/24 0830 01/25/24 0943  BP: (!) 156/61 (!) 167/50 (!) 167/52 (!) 150/50  Pulse: 80 78 82 80  Resp: 18 19 (!) 22 (!) 21  Temp: 98.5 F  (36.9 C)   (!) 100.4 F (38 C)  TempSrc: Oral   Oral  SpO2: 94% 95% 94% 96%  Weight:      Height:       Physical Exam Vitals and nursing note reviewed.  Constitutional:      Appearance: She is ill-appearing.  HENT:     Head: Normocephalic.     Nose: No rhinorrhea.     Mouth/Throat:     Mouth: Mucous membranes are dry.  Eyes:     General: No scleral icterus.    Pupils: Pupils are equal, round, and reactive to light.  Cardiovascular:     Rate and Rhythm: Normal rate.  Pulmonary:     Effort: Pulmonary effort is normal.     Breath sounds: Normal breath sounds.  Abdominal:     General: Bowel sounds are normal. There is no distension.     Palpations: Abdomen is soft.     Tenderness: There is abdominal tenderness.  Musculoskeletal:     Cervical back: Neck supple.     Right lower leg: No edema.     Left lower leg: No edema.  Skin:    General: Skin is warm and dry.  Neurological:     General: No focal deficit present.     Mental Status: She is alert and oriented to person, place, and time.  Psychiatric:        Mood and Affect: Mood normal.        Behavior: Behavior normal.     Data Reviewed:  Results are pending, will review when available. 04/23/2017 echocardiogram report. Study Conclusions   - Left ventricle: The cavity size was normal. Wall thickness was    increased in a pattern of mild LVH. Systolic function was normal.    The estimated ejection fraction was in the range of 60% to 65%.    Wall motion was normal; there were no regional wall motion    abnormalities. Doppler parameters are consistent with abnormal    left ventricular relaxation (grade 1 diastolic dysfunction).  - Aortic valve: There was mild regurgitation.  - Atrial septum: There was an atrial septal aneurysm.  - Pulmonary arteries: Systolic pressure was mildly increased. PA    peak pressure: 33 mm Hg (S).   Impressions:   - Normal LV systolic function; mild diastolic dysfunction; mild    LVH;  mild AI; trace TR with mildly elevated pulmonary pressure.   Echocardiogram report done today. IMPRESSIONS:   1. Left ventricular ejection fraction, by estimation, is 60 to 65%. The  left ventricle has normal function. The left ventricle has no regional  wall motion abnormalities. There is mild left ventricular hypertrophy of  the basal-septal segment. Left  ventricular diastolic parameters are consistent with Grade I diastolic  dysfunction (impaired relaxation).   2. Right ventricular systolic function is normal. The right ventricular  size is normal. There is moderately elevated pulmonary artery systolic  pressure. The estimated right ventricular systolic pressure is 55.7 mmHg.   3. The mitral valve is normal in structure. Trivial mitral valve  regurgitation. No evidence of mitral stenosis.  4. The aortic valve is tricuspid. Aortic valve regurgitation is trivial.  Aortic valve sclerosis/calcification is present, without any evidence of  aortic stenosis. Aortic regurgitation PHT measures 553 msec. Aortic valve  area, by VTI measures 1.78 cm.  Aortic valve mean gradient measures 6.0 mmHg. Aortic valve Vmax measures  1.62 m/s.   5. The inferior vena cava is dilated in size with <50% respiratory  variability, suggesting right atrial pressure of 15 mmHg.   EKG: Vent. rate 71 BPM PR interval 152 ms QRS duration 98 ms QT/QTcB 413/449 ms P-R-T axes 78 28 29 Sinus rhythm LVH with secondary repolarization abnormality  Assessment and Plan: Principal Problem:   Gallbladder dilatation Associated with:   Fever In the setting of:   Acute cholecystitis Telemetry/inpatient. Gentle IV fluids. Clear liquid diet. Keep n.p.o. for after midnight. Analgesics as needed. Antiemetics as needed. Pantoprazole 40 mg IVP daily. Follow CBC, CMP in AM. General surgery consult appreciated.  Active Problems:   Dyspnea on exertion No symptoms with activities at home. However, after 20 to 30  minutes of walking gets dyspneic and lightheaded. Cardiology consult has been requested for the morning.    Grade I diastolic dysfunction No significant changes in echocardiogram. Continue ARB daily as usual.    Stage 3a chronic kidney disease (HCC) Monitor renal function electrolytes.    Vitamin B12 deficiency Continue B12 supplementation.    Hypokalemia Replaced. Magnesium sulfate 2 g IVPB given as well. Follow-up potassium level in the morning.    Essential hypertension Continue losartan  25 mg p.o. daily.    Type 2 diabetes mellitus with hyperglycemia (HCC) Currently on clear liquid diet. CBG monitoring before meals and bedtime. If needed, add on RI SS.    Advance Care Planning:   Code Status: Full Code   Consults: [facility].  Family Communication: Her daughter and granddaughter were at bedside.  Severity of Illness: The appropriate patient status for this patient is INPATIENT. Inpatient status is judged to be reasonable and necessary in order to provide the required intensity of service to ensure the patient's safety. The patient's presenting symptoms, physical exam findings, and initial radiographic and laboratory data in the context of their chronic comorbidities is felt to place them at high risk for further clinical deterioration. Furthermore, it is not anticipated that the patient will be medically stable for discharge from the hospital within 2 midnights of admission.   * I certify that at the point of admission it is my clinical judgment that the patient will require inpatient hospital care spanning beyond 2 midnights from the point of admission due to high intensity of service, high risk for further deterioration and high frequency of surveillance required.*  Author: Danice Dural, MD 01/25/2024 9:46 AM  For on call review [email or web address].   This document was prepared using Dragon voice recognition software and may contain some unintended  transcription errors.

## 2024-01-25 NOTE — ED Notes (Signed)
 Patient transported to CT

## 2024-01-25 NOTE — Progress Notes (Signed)
 MD was notified that RN gave PRN Tylenol  for a fever of 101.3. RN also turned the temperature down in the room.

## 2024-01-25 NOTE — ED Provider Notes (Signed)
 Discussed with Dr. Bonita Bussing with the hospitalist service who will admit the patient for further treatment.  General surgery Marlin Simmonds, [place]) has been notified as well.   Hershel Los, MD 01/25/24 [phone]

## 2024-01-25 NOTE — ED Notes (Signed)
 ED Provider at bedside.

## 2024-01-25 NOTE — Consult Note (Signed)
 Consult Note  Crystal Richard 07-08-1936  [id number].    Requesting MD: Hershel Los, MD Chief Complaint/Reason for Consult: RUQ abdominal pain HPI:  Patient is an 88 year old female who presented to [facility] with RUQ abdominal pain that started yesterday evening around 10 PM. Patient reports she had tacos for dinner earlier in the evening. Pain is right sided and radiating to the back and across her abdomen. Associated nausea and vomiting that was NBNB. Denies fevers, chills, diarrhea, constipation, urinary symptoms, chest pain, SOB. She has not had similar symptoms previously. No prior abdominal surgery. Pain has been constant since onset and now across entire upper abdomen but worse on the right. PMH otherwise significant for HTN. Not on any blood thinners and NKDA.   ROS: Negative other than HPI  History reviewed. No pertinent family history.  Past Medical History:  Diagnosis Date   Grade I diastolic dysfunction 01/25/2024   Hypercholesteremia    Hypertension    Stage 3a chronic kidney disease (HCC) 05/03/2019    Past Surgical History:  Procedure Laterality Date   JOINT REPLACEMENT     bil hips   TOTAL HIP ARTHROPLASTY      Social History:  reports that she has quit smoking. She has never used smokeless tobacco. She reports that she does not drink alcohol and does not use drugs.  Allergies: No Known Allergies  Medications Prior to Admission  Medication Sig Dispense Refill   acetaminophen  (TYLENOL ) 500 MG tablet Take 500 mg by mouth every 6 (six) hours as needed for headache (pain).     amLODipine (NORVASC) 10 MG tablet Take 10 mg by mouth.     losartan  (COZAAR ) 100 MG tablet Take 1 tablet (100 mg total) by mouth daily. 30 tablet 0   meclizine  (ANTIVERT ) 25 MG tablet Take 1 tablet (25 mg total) by mouth 3 (three) times daily as needed for dizziness or nausea. (Patient not taking: Reported on 04/23/2017) 20 tablet 0   pravastatin  (PRAVACHOL ) 40 MG tablet Take 20 mg by  mouth at bedtime.      Blood pressure (!) 150/50, pulse 80, temperature (!) 100.4 F (38 C), temperature source Oral, resp. rate (!) 21, height 5\' 4"  (1.626 m), weight 49.9 kg, SpO2 96%. Physical Exam:  General: pleasant, WD, WN female who is laying in bed in NAD HEENT: head is normocephalic, atraumatic.  Sclera are anicteric.  Ears and nose without any masses or lesions.  Mouth is pink and moist Heart: regular, rate, and rhythm. Palpable radial and pedal pulses bilaterally Lungs: No wheezes, rhonchi, or rales noted.  Respiratory effort nonlabored Abd: soft, TTP in RUQ with positive Murphy sign, ND, no masses, hernias, or organomegaly MS: all 4 extremities are symmetrical with no cyanosis, clubbing, or edema. Skin: warm and dry with no masses, lesions, or rashes Neuro: Cranial nerves 2-12 grossly intact, sensation is normal throughout Psych: A&Ox3 with an appropriate affect.   Results for orders placed or performed during the hospital encounter of 01/25/24 (from the past 48 hours)  Comprehensive metabolic panel     Status: Abnormal   Collection Time: 01/25/24  3:28 AM  Result Value Ref Range   Sodium 140 135 - 145 mmol/L   Potassium 3.3 (L) 3.5 - 5.1 mmol/L   Chloride 103 98 - 111 mmol/L   CO2 24 22 - 32 mmol/L   Glucose, Bld 209 (H) 70 - 99 mg/dL    Comment: Glucose reference range applies only to samples taken  after fasting for at least 8 hours.   BUN 15 8 - 23 mg/dL   Creatinine, Ser 5.62 0.44 - 1.00 mg/dL   Calcium 9.6 8.9 - 13.0 mg/dL   Total Protein 7.4 6.5 - 8.1 g/dL   Albumin 4.3 3.5 - 5.0 g/dL   AST 865 (H) 15 - 41 U/L   ALT 76 (H) 0 - 44 U/L   Alkaline Phosphatase 104 38 - 126 U/L   Total Bilirubin 0.8 0.0 - 1.2 mg/dL   GFR, Estimated >78 >46 mL/min    Comment: (NOTE) Calculated using the CKD-EPI Creatinine Equation (2021)    Anion gap 13 5 - 15    Comment: Performed at [facility], [address] [place], [place] [address]  CBC with Differential      Status: Abnormal   Collection Time: 01/25/24  3:28 AM  Result Value Ref Range   WBC 8.5 4.0 - 10.5 K/uL   RBC 3.73 (L) 3.87 - 5.11 MIL/uL   Hemoglobin 10.6 (L) 12.0 - 15.0 g/dL   HCT 28.4 (L) 13.2 - 44.0 %   MCV 88.2 80.0 - 100.0 fL   MCH 28.4 26.0 - 34.0 pg   MCHC 32.2 30.0 - 36.0 g/dL   RDW 10.2 72.5 - 36.6 %   Platelets 222 150 - 400 K/uL   nRBC 0.0 0.0 - 0.2 %   Neutrophils Relative % 90 %   Neutro Abs 7.6 1.7 - 7.7 K/uL   Lymphocytes Relative 8 %   Lymphs Abs 0.7 0.7 - 4.0 K/uL   Monocytes Relative 1 %   Monocytes Absolute 0.1 0.1 - 1.0 K/uL   Eosinophils Relative 1 %   Eosinophils Absolute 0.1 0.0 - 0.5 K/uL   Basophils Relative 0 %   Basophils Absolute 0.0 0.0 - 0.1 K/uL   Immature Granulocytes 0 %   Abs Immature Granulocytes 0.01 0.00 - 0.07 K/uL    Comment: Performed at [facility], [address] [place], [place] [address]  Lipase, blood     Status: Abnormal   Collection Time: 01/25/24  3:28 AM  Result Value Ref Range   Lipase 55 (H) 11 - 51 U/L    Comment: Performed at [facility], [address] [place], [place] [address]  Troponin T, High Sensitivity     Status: None   Collection Time: 01/25/24  3:28 AM  Result Value Ref Range   Troponin T High Sensitivity <15 <19 ng/L    Comment: (NOTE) Biotin concentrations > 1000 ng/mL falsely decrease TnT results.  Serial cardiac troponin measurements are suggested.  Refer to the Links section for chest pain algorithms and additional  guidance. Performed at [facility], [address] [place], [place] [address]   Urinalysis, Routine w reflex microscopic -Urine, Clean Catch     Status: Abnormal   Collection Time: 01/25/24  5:30 AM  Result Value Ref Range   Color, Urine YELLOW YELLOW   APPearance CLEAR CLEAR   Specific Gravity, Urine 1.015 1.005 - 1.030   pH 6.0 5.0 - 8.0   Glucose, UA NEGATIVE NEGATIVE mg/dL   Hgb urine dipstick TRACE (A) NEGATIVE   Bilirubin Urine NEGATIVE  NEGATIVE   Ketones, ur NEGATIVE NEGATIVE mg/dL   Protein, ur NEGATIVE NEGATIVE mg/dL   Nitrite NEGATIVE NEGATIVE   Leukocytes,Ua NEGATIVE NEGATIVE    Comment: Performed at [facility], [address] [place], [place] [address]  Urinalysis, Microscopic (reflex)     Status: Abnormal   Collection Time: 01/25/24  5:30 AM  Result Value Ref Range   RBC / HPF 0-5 0 - 5 RBC/hpf   WBC, UA 0-5 0 - 5 WBC/hpf   Bacteria, UA RARE (A) NONE SEEN   Squamous Epithelial / HPF 0-5 0 - 5 /HPF    Comment: Performed at [facility], [address] [place], [place] [address]  Troponin T, High Sensitivity     Status: None   Collection Time: 01/25/24  6:37 AM  Result Value Ref Range   Troponin T High Sensitivity <15 <19 ng/L    Comment: (NOTE) Biotin concentrations > 1000 ng/mL falsely decrease TnT results.  Serial cardiac troponin measurements are suggested.  Refer to the Links section for chest pain algorithms and additional  guidance. Performed at [facility], [address] [place], [place] [address]   Magnesium     Status: None   Collection Time: 01/25/24  6:37 AM  Result Value Ref Range   Magnesium 2.0 1.7 - 2.4 mg/dL    Comment: Performed at [facility], [address] [place], [place] [address]  Phosphorus     Status: None   Collection Time: 01/25/24  6:37 AM  Result Value Ref Range   Phosphorus 3.0 2.5 - 4.6 mg/dL    Comment: Performed at [facility], [address] [place], [place] [address]  Glucose, capillary     Status: Abnormal   Collection Time: 01/25/24 10:16 AM  Result Value Ref Range   Glucose-Capillary 127 (H) 70 - 99 mg/dL    Comment: Glucose reference range applies only to samples taken after fasting for at least 8 hours.   US  Abdomen Limited RUQ (LIVER/GB) Result Date: 01/25/2024 CLINICAL DATA:  88 year old female with right abdominal pain, nausea vomiting, suspicious CT appearance of the gallbladder this  morning. EXAM: ULTRASOUND ABDOMEN LIMITED RIGHT UPPER QUADRANT COMPARISON:  CT Abdomen and Pelvis 0502 hours. FINDINGS: Gallbladder: Similar gallbladder distension. Small (4 mm) areas of layering sludge or nonshadowing stones (image 11). Gallbladder wall thickness is at the upper limits of normal, 3 mm. No sonographic Murphy sign elicited. No pericholecystic fluid. Common bile duct: Diameter: 2 mm, normal. Liver: No focal lesion identified. Within normal limits in parenchymal echogenicity. Portal vein is patent on color Doppler imaging with normal direction of blood flow towards the liver. Other: Negative visible right kidney. IMPRESSION: 1. Distended gallbladder with small stones and/or foci of sludge. Wall thickness at the upper limits of normal. But absent sonographic Abigail Abler sign argues against acute cholecystitis. 2. Negative ultrasound appearance of the liver and bile ducts. Electronically Signed   By: Marlise Simpers M.D.   On: 01/25/2024 06:48   CT ABDOMEN PELVIS W CONTRAST Result Date: 01/25/2024 CLINICAL DATA:  88 year old female with right lower abdominal pain. Nausea vomiting. EXAM: CT ABDOMEN AND PELVIS WITH CONTRAST TECHNIQUE: Multidetector CT imaging of the abdomen and pelvis was performed using the standard protocol following bolus administration of intravenous contrast. RADIATION DOSE REDUCTION: This exam was performed according to the departmental dose-optimization program which includes automated exposure control, adjustment of the mA and/or kV according to patient size and/or use of iterative reconstruction technique. CONTRAST:  100mL OMNIPAQUE IOHEXOL 300 MG/ML  SOLN COMPARISON:  Report of abdomen ultrasound 06/26/1994 (no images available). FINDINGS: Lower chest: Curvilinear bilateral lower lobe lung base scarring. Heart size at the upper limits of normal. No  pericardial or pleural effusion. Hepatobiliary: Gallbladder somewhat distended but otherwise within normal limits. Bile ducts appear within  normal limits for age. Liver enhancement is within normal limits; subcentimeter but circumscribed low-density area in the inferior right hepatic lobe (coronal image 59) is compatible with benign cyst (no follow-up imaging recommended). Pancreas: Negative. Spleen: Negative.  Splenule, normal variant. Adrenals/Urinary Tract: Normal adrenal glands. Nonobstructed kidneys with symmetric renal enhancement and contrast excretion. Diminutive ureters. Bladder obscured by hip arthroplasty artifact. Stomach/Bowel: Rectum and distal sigmoid colon obscured by hip arthroplasty artifact. Retained stool throughout redundant transverse colon, splenic flexure, descending colon and proximal sigmoid. Less pronounced retained stool in the right colon. Normal appendix is identified (coronal images 52 and 65. No large bowel inflammation identified. Terminal ileum is decompressed. No dilated small bowel. Small volume fluid in the gastric fundus. Decompressed distal stomach and duodenum. No pneumoperitoneum or free fluid identified. No mesenteric inflammation identified. Vascular/Lymphatic: Advanced Calcified aortic atherosclerosis. Major arterial structures in the abdomen and pelvis remain patent despite bulky calcified atherosclerosis. Abdominal aortic size remains normal. Portal venous system is patent. No lymphadenopathy. Reproductive: Partially obscured by hip arthroplasty artifact, otherwise negative. Other: Hip arthroplasty streak artifact limits pelvic visualization. No free fluid is evident. Musculoskeletal: Bilateral hip arthroplasty with streak artifact. Lumbar spine degeneration including mild grade 1 spondylolisthesis at L4-L5 with vacuum disc. Bulky lumbar facet arthropathy greater on the right. No acute osseous abnormality identified. IMPRESSION: 1. Normal appendix. No acute or inflammatory process identified in the abdomen. But retained stool throughout redundant large bowel, consider constipation. 2. Bilateral hip  arthroplasty artifact obscures most visualization of the pelvis. 3. Severe  Aortic Atherosclerosis (ICD10-I70.0). Electronically Signed   By: Marlise Simpers M.D.   On: 01/25/2024 05:20      Assessment/Plan Acute cholecystitis Constipation  - CT today with retained stool throughout colon - recommend bowel regimen - RUQ US  shows distended gallbladder with small stones/sludge, wall thickness upper limit of normal - AST/ALT mildly elevated at 181/76, no leukocytosis  - Hx and exam consistent with acute cholecystitis  - I have explained the procedure, risks, and aftercare of Laparoscopic cholecystectomy.  Risks include but are not limited to anesthesia (MI, CVA, death, prolonged intubation and aspiration), bleeding, infection, wound problems, hernia, bile leak, injury to common bile duct/liver/intestine, possible need for subtotal cholecystectomy or open cholecystectomy, increased risk of DVT/PE and diarrhea post op.  She seems to understand and agrees to proceed. Will plan for OR tomorrow pending medical clearance  FEN: CLD, NPO after MN, IVF per [facility] VTE: ok to have SQH or LMWH from surgery standpoint ID: [email or web address]   I reviewed ED provider notes, hospitalist notes, last 24 h vitals and pain scores, last 48 h intake and output, last 24 h labs and trends, and last 24 h imaging results.  This care required high  level of medical decision making.   Annetta Killian, [facility] 01/25/2024, 10:46 AM Please see Amion for pager number during day hours 7:00am-4:30pm

## 2024-01-25 NOTE — Progress Notes (Signed)
  Echocardiogram 2D Echocardiogram has been performed.  Devany Aja L Raziah Funnell RDCS 01/25/2024, 12:26 PM

## 2024-01-25 NOTE — ED Triage Notes (Signed)
 Lower stomach and back pain states"all the way around" started about 20:00, denies Hx or injury.

## 2024-01-25 NOTE — Progress Notes (Signed)
 Plan of Care Note for accepted transfer   Patient: Crystal Richard MRN: [id number]   DOA: 01/25/2024  Facility requesting transfer: [facility].  Requesting Provider: Hershel Los, MD Reason for transfer: Abnormal LFT's, Gallbladder distension and cholelithiasis.  Facility course:   88 year old female with a past medical history of hypertension, history of anemia, anxiety, hyperlipidemia, osteoarthritis, osteoporosis history of presyncope, stage IIIa CKD vitamin B12 deficiency who presented to the emergency department complaints of abdominal and back pain since around 2000 yesterday.   Troponin T, High Sensitivity [[id number]]   Collected: 01/25/24 0637   Updated: 01/25/24 0700   Specimen Type: Blood   Specimen Source: Vein    Troponin T High Sensitivity <15 ng/L  Troponin T, High Sensitivity [[id number]]   Collected: 01/25/24 0328   Updated: 01/25/24 0614   Specimen Type: Blood    Troponin T High Sensitivity <15 ng/L  Urinalysis, Microscopic (reflex) [[id number]] (Abnormal)   Collected: 01/25/24 0530   Updated: 01/25/24 0602    RBC / HPF 0-5 RBC/hpf   WBC, UA 0-5 WBC/hpf   Bacteria, UA RARE Abnormal    Squamous Epithelial / HPF 0-5 /HPF  Urinalysis, Routine w reflex microscopic -Urine, Clean Catch [[id number]] (Abnormal)   Collected: 01/25/24 0530   Updated: 01/25/24 0602   Specimen Source: Urine, Clean Catch    Color, Urine YELLOW   APPearance CLEAR   Specific Gravity, Urine 1.015   pH 6.0   Glucose, UA NEGATIVE mg/dL   Hgb urine dipstick TRACE Abnormal    Bilirubin Urine NEGATIVE   Ketones, ur NEGATIVE mg/dL   Protein, ur NEGATIVE mg/dL   Nitrite NEGATIVE   Leukocytes,Ua NEGATIVE  Comprehensive metabolic panel [[id number]] (Abnormal)   Collected: 01/25/24 0328   Updated: 01/25/24 0402   Specimen Type: Blood   Specimen Source: Vein    Sodium 140 mmol/L   Potassium 3.3 Low  mmol/L   Chloride 103 mmol/L   CO2 24 mmol/L   Glucose, Bld 209 High  mg/dL   BUN 15  mg/dL   Creatinine, Ser 5.36 mg/dL   Calcium 9.6 mg/dL   Total Protein 7.4 g/dL   Albumin 4.3 g/dL   AST 644 High  U/L   ALT 76 High  U/L   Alkaline Phosphatase 104 U/L   Total Bilirubin 0.8 mg/dL   GFR, Estimated >03 mL/min   Anion gap 13  Lipase, blood [[id number]] (Abnormal)   Collected: 01/25/24 0328   Updated: 01/25/24 0402   Specimen Type: Blood   Specimen Source: Vein    Lipase 55 High  U/L  CBC with Differential [[id number]] (Abnormal)   Collected: 01/25/24 0328   Updated: 01/25/24 0338   Specimen Type: Blood   Specimen Source: Vein    WBC 8.5 K/uL   RBC 3.73 Low  MIL/uL   Hemoglobin 10.6 Low  g/dL   HCT 51.8 Low  %   MCV 88.2 fL   MCH 28.4 pg   MCHC 32.2 g/dL   RDW 84.1 %   Platelets 222 K/uL   nRBC 0.0 %   Neutrophils Relative % 90 %   Neutro Abs 7.6 K/uL   Lymphocytes Relative 8 %   Lymphs Abs 0.7 K/uL   Monocytes Relative 1 %   Monocytes Absolute 0.1 K/uL   Eosinophils Relative 1 %   Eosinophils Absolute 0.1 K/uL   Basophils Relative 0 %   Basophils Absolute 0.0 K/uL   Immature Granulocytes 0 %   Abs Immature Granulocytes 0.01 K/uL   Imaging:  CT abdomen/pelvis with contrast.  IMPRESSION: 1. Normal appendix. No acute or inflammatory process identified in the abdomen. But retained stool throughout redundant large bowel, consider constipation. 2. Bilateral hip arthroplasty artifact obscures most visualization of the pelvis. 3. Severe  Aortic Atherosclerosis (ICD10-I70.0).   RUQ US . Similar gallbladder distension. Small (4 mm) areas of layering sludge or nonshadowing stones (image 11). Gallbladder wall thickness is at the upper limits of normal, 3 mm. No sonographic Murphy sign elicited. No pericholecystic fluid.   Common bile duct:  Diameter: 2 mm, normal.   Liver: No focal lesion identified. Within normal limits in parenchymal echogenicity. Portal vein is patent on color Doppler imaging with normal direction of blood flow towards the  liver.   Other: Negative visible right kidney.   IMPRESSION: 1. Distended gallbladder with small stones and/or foci of sludge. Wall thickness at the upper limits of normal. But absent sonographic Abigail Abler sign argues against acute cholecystitis.   2. Negative ultrasound appearance of the liver and bile ducts.     Electronically Signed   By: Marlise Simpers M.D.   On: 01/25/2024 06:48  Plan of care: The patient is accepted for admission to Telemetry unit, at [facility].  The patient received ceftriaxone 2 g IVPB, fentanyl 50 mcg IVP, metoclopramide 10 mg IVP, ondansetron 4 mg IVP x 2, KCl 10 mg IVP x 1 and 500 mL normal saline bolus.  [facility] will evaluate the patient once he arrives to [facility].    Author: Danice Dural, MD 01/25/2024  Check [email or web address] for on-call coverage.  Nursing staff, Please call [facility] Admits & Consults System-Wide number on Amion as soon as patient's arrival, so appropriate admitting provider can evaluate the pt.

## 2024-01-25 NOTE — ED Provider Notes (Signed)
 [facility] Provider Note   CSN: [id number] Arrival date & time: 01/25/24  0315     History  Chief Complaint  Patient presents with   Abdominal Pain    Crystal Richard is a 88 y.o. female.  The history is provided by the patient and medical records.  Abdominal Pain Crystal Richard is a 88 y.o. female who presents to the Emergency Department complaining of abdominal pain.  She presents to the emergency department for evaluation of sudden onset right-sided abdominal pain radiates to her back that started around 10 PM.  Pain is constant and radiates across her entire abdomen.  Has associated nausea and vomiting.  No hematemesis.  No fevers, diarrhea, dysuria.  She has a history of hypertension, no prior similar symptoms.  No prior abdominal surgeries.     Home Medications Prior to Admission medications   Medication Sig Start Date End Date Taking? Authorizing Provider  acetaminophen  (TYLENOL ) 500 MG tablet Take 500 mg by mouth every 6 (six) hours as needed for headache (pain).    [provider]  amLODipine (NORVASC) 10 MG tablet Take 10 mg by mouth. 06/02/22   [provider]  losartan  (COZAAR ) 100 MG tablet Take 1 tablet (100 mg total) by mouth daily. 04/24/17   Lavaughn Portland, MD  meclizine  (ANTIVERT ) 25 MG tablet Take 1 tablet (25 mg total) by mouth 3 (three) times daily as needed for dizziness or nausea. Patient not taking: Reported on 04/23/2017 02/21/16   Debbra Fairy, PA-C  pravastatin  (PRAVACHOL ) 40 MG tablet Take 20 mg by mouth at bedtime. 01/27/17   [provider]      Allergies    Patient has no known allergies.    Review of Systems   Review of Systems  Gastrointestinal:  Positive for abdominal pain.  All other systems reviewed and are negative.   Physical Exam Updated Vital Signs BP (!) 148/60   Pulse 74   Temp 98 F (36.7 C) (Oral)   Resp 15   Ht 5\' 4"  (1.626 m)   Wt 49.9 kg   SpO2 95%   BMI  18.88 kg/m  Physical Exam Vitals and nursing note reviewed.  Constitutional:      Appearance: She is well-developed.  HENT:     Head: Normocephalic and atraumatic.  Cardiovascular:     Rate and Rhythm: Normal rate and regular rhythm.     Heart sounds: No murmur heard. Pulmonary:     Effort: Pulmonary effort is normal. No respiratory distress.     Breath sounds: Normal breath sounds.  Abdominal:     Palpations: Abdomen is soft.     Tenderness: There is abdominal tenderness. There is no guarding or rebound.     Comments: Moderate right-sided abdominal tenderness with voluntary guarding  Musculoskeletal:        General: No tenderness.  Skin:    General: Skin is warm and dry.  Neurological:     Mental Status: She is alert and oriented to person, place, and time.  Psychiatric:        Behavior: Behavior normal.     ED Results / Procedures / Treatments   Labs (all labs ordered are listed, but only abnormal results are displayed) Labs Reviewed  COMPREHENSIVE METABOLIC PANEL WITH GFR - Abnormal; Notable for the following components:      Result Value   Potassium 3.3 (*)    Glucose, Bld 209 (*)    AST 181 (*)  ALT 76 (*)    All other components within normal limits  CBC WITH DIFFERENTIAL/PLATELET - Abnormal; Notable for the following components:   RBC 3.73 (*)    Hemoglobin 10.6 (*)    HCT 32.9 (*)    All other components within normal limits  LIPASE, BLOOD - Abnormal; Notable for the following components:   Lipase 55 (*)    All other components within normal limits  URINALYSIS, ROUTINE W REFLEX MICROSCOPIC - Abnormal; Notable for the following components:   Hgb urine dipstick TRACE (*)    All other components within normal limits  URINALYSIS, MICROSCOPIC (REFLEX) - Abnormal; Notable for the following components:   Bacteria, UA RARE (*)    All other components within normal limits  TROPONIN T, HIGH SENSITIVITY  TROPONIN T, HIGH SENSITIVITY    EKG EKG  Interpretation Date/Time:  Thursday Jan 25 2024 05:52:12 EDT Ventricular Rate:  71 PR Interval:  152 QRS Duration:  98 QT Interval:  413 QTC Calculation: 449 R Axis:   28  Text Interpretation: Sinus rhythm LVH with secondary repolarization abnormality Confirmed by Kelsey Patricia [phone]) on 01/25/2024 5:54:11 AM  Radiology US  Abdomen Limited RUQ (LIVER/GB) Result Date: 01/25/2024 CLINICAL DATA:  88 year old female with right abdominal pain, nausea vomiting, suspicious CT appearance of the gallbladder this morning. EXAM: ULTRASOUND ABDOMEN LIMITED RIGHT UPPER QUADRANT COMPARISON:  CT Abdomen and Pelvis 0502 hours. FINDINGS: Gallbladder: Similar gallbladder distension. Small (4 mm) areas of layering sludge or nonshadowing stones (image 11). Gallbladder wall thickness is at the upper limits of normal, 3 mm. No sonographic Murphy sign elicited. No pericholecystic fluid. Common bile duct: Diameter: 2 mm, normal. Liver: No focal lesion identified. Within normal limits in parenchymal echogenicity. Portal vein is patent on color Doppler imaging with normal direction of blood flow towards the liver. Other: Negative visible right kidney. IMPRESSION: 1. Distended gallbladder with small stones and/or foci of sludge. Wall thickness at the upper limits of normal. But absent sonographic Crystal Richard sign argues against acute cholecystitis. 2. Negative ultrasound appearance of the liver and bile ducts. Electronically Signed   By: Marlise Simpers M.D.   On: 01/25/2024 06:48   CT ABDOMEN PELVIS W CONTRAST Result Date: 01/25/2024 CLINICAL DATA:  88 year old female with right lower abdominal pain. Nausea vomiting. EXAM: CT ABDOMEN AND PELVIS WITH CONTRAST TECHNIQUE: Multidetector CT imaging of the abdomen and pelvis was performed using the standard protocol following bolus administration of intravenous contrast. RADIATION DOSE REDUCTION: This exam was performed according to the departmental dose-optimization program which includes  automated exposure control, adjustment of the mA and/or kV according to patient size and/or use of iterative reconstruction technique. CONTRAST:  100mL OMNIPAQUE IOHEXOL 300 MG/ML  SOLN COMPARISON:  Report of abdomen ultrasound 06/26/1994 (no images available). FINDINGS: Lower chest: Curvilinear bilateral lower lobe lung base scarring. Heart size at the upper limits of normal. No pericardial or pleural effusion. Hepatobiliary: Gallbladder somewhat distended but otherwise within normal limits. Bile ducts appear within normal limits for age. Liver enhancement is within normal limits; subcentimeter but circumscribed low-density area in the inferior right hepatic lobe (coronal image 59) is compatible with benign cyst (no follow-up imaging recommended). Pancreas: Negative. Spleen: Negative.  Splenule, normal variant. Adrenals/Urinary Tract: Normal adrenal glands. Nonobstructed kidneys with symmetric renal enhancement and contrast excretion. Diminutive ureters. Bladder obscured by hip arthroplasty artifact. Stomach/Bowel: Rectum and distal sigmoid colon obscured by hip arthroplasty artifact. Retained stool throughout redundant transverse colon, splenic flexure, descending colon and proximal sigmoid. Less pronounced retained stool  in the right colon. Normal appendix is identified (coronal images 52 and 65. No large bowel inflammation identified. Terminal ileum is decompressed. No dilated small bowel. Small volume fluid in the gastric fundus. Decompressed distal stomach and duodenum. No pneumoperitoneum or free fluid identified. No mesenteric inflammation identified. Vascular/Lymphatic: Advanced Calcified aortic atherosclerosis. Major arterial structures in the abdomen and pelvis remain patent despite bulky calcified atherosclerosis. Abdominal aortic size remains normal. Portal venous system is patent. No lymphadenopathy. Reproductive: Partially obscured by hip arthroplasty artifact, otherwise negative. Other: Hip  arthroplasty streak artifact limits pelvic visualization. No free fluid is evident. Musculoskeletal: Bilateral hip arthroplasty with streak artifact. Lumbar spine degeneration including mild grade 1 spondylolisthesis at L4-L5 with vacuum disc. Bulky lumbar facet arthropathy greater on the right. No acute osseous abnormality identified. IMPRESSION: 1. Normal appendix. No acute or inflammatory process identified in the abdomen. But retained stool throughout redundant large bowel, consider constipation. 2. Bilateral hip arthroplasty artifact obscures most visualization of the pelvis. 3. Severe  Aortic Atherosclerosis (ICD10-I70.0). Electronically Signed   By: Marlise Simpers M.D.   On: 01/25/2024 05:20    Procedures Procedures    Medications Ordered in ED Medications  metoCLOPramide (REGLAN) injection 10 mg (has no administration in time range)  cefTRIAXone (ROCEPHIN) 2 g in sodium chloride  0.9 % 100 mL IVPB (has no administration in time range)  fentaNYL (SUBLIMAZE) injection 50 mcg (50 mcg Intravenous Given 01/25/24 0337)  ondansetron (ZOFRAN) injection 4 mg (4 mg Intravenous Given 01/25/24 0337)  iohexol (OMNIPAQUE) 300 MG/ML solution 100 mL (100 mLs Intravenous Contrast Given 01/25/24 0451)  ondansetron (ZOFRAN) injection 4 mg (4 mg Intravenous Given 01/25/24 0517)  sodium chloride  0.9 % bolus 500 mL (0 mLs Intravenous Stopped 01/25/24 1610)    ED Course/ Medical Decision Making/ A&P                                 Medical Decision Making Amount and/or Complexity of Data Reviewed Labs: ordered. Radiology: ordered.  Risk Prescription drug management. Decision regarding hospitalization.   Patient with history of hypertension here for evaluation of abdominal pain, vomiting.  Patient with moderate tenderness on examination.  CT abdomen pelvis with distended gallbladder, otherwise unremarkable.  Patient with persistent pain on the right abdomen, right upper quadrant ultrasound was obtained, which  demonstrates distended gallbladder but negative Murphy sign.  Patient does have ongoing discomfort with recurrent vomiting.  She has minimally elevated transaminases.  Plan to admit for ongoing management.  Patient care transferred pending formal consults.        Final Clinical Impression(s) / ED Diagnoses Final diagnoses:  None    Rx / DC Orders ED Discharge Orders     None         Kelsey Patricia, MD 01/25/24 [phone]

## 2024-01-26 ENCOUNTER — Other Ambulatory Visit: Payer: Self-pay

## 2024-01-26 ENCOUNTER — Inpatient Hospital Stay (HOSPITAL_COMMUNITY)

## 2024-01-26 ENCOUNTER — Inpatient Hospital Stay (HOSPITAL_COMMUNITY): Admitting: Anesthesiology

## 2024-01-26 ENCOUNTER — Encounter (HOSPITAL_COMMUNITY)
Admission: EM | Disposition: A | Discharge status: Home / Self Care | Payer: Self-pay | Source: Home / Self Care | Attending: Internal Medicine

## 2024-01-26 ENCOUNTER — Encounter (HOSPITAL_COMMUNITY): Payer: Self-pay

## 2024-01-26 DIAGNOSIS — K810 Acute cholecystitis: Secondary | ICD-10-CM | POA: Diagnosis not present

## 2024-01-26 DIAGNOSIS — Z01810 Encounter for preprocedural cardiovascular examination: Secondary | ICD-10-CM

## 2024-01-26 DIAGNOSIS — I129 Hypertensive chronic kidney disease with stage 1 through stage 4 chronic kidney disease, or unspecified chronic kidney disease: Secondary | ICD-10-CM | POA: Diagnosis not present

## 2024-01-26 DIAGNOSIS — Z87891 Personal history of nicotine dependence: Secondary | ICD-10-CM

## 2024-01-26 DIAGNOSIS — K828 Other specified diseases of gallbladder: Secondary | ICD-10-CM | POA: Diagnosis not present

## 2024-01-26 DIAGNOSIS — D649 Anemia, unspecified: Secondary | ICD-10-CM

## 2024-01-26 DIAGNOSIS — N1831 Chronic kidney disease, stage 3a: Secondary | ICD-10-CM | POA: Diagnosis not present

## 2024-01-26 DIAGNOSIS — R0609 Other forms of dyspnea: Secondary | ICD-10-CM

## 2024-01-26 DIAGNOSIS — I10 Essential (primary) hypertension: Secondary | ICD-10-CM

## 2024-01-26 HISTORY — PX: CHOLECYSTECTOMY: SHX55

## 2024-01-26 LAB — CBC
HCT: 28.7 % — ABNORMAL LOW (ref 36.0–46.0)
Hemoglobin: 9 g/dL — ABNORMAL LOW (ref 12.0–15.0)
MCH: 29.3 pg (ref 26.0–34.0)
MCHC: 31.4 g/dL (ref 30.0–36.0)
MCV: 93.5 fL (ref 80.0–100.0)
Platelets: 133 10*3/uL — ABNORMAL LOW (ref 150–400)
RBC: 3.07 MIL/uL — ABNORMAL LOW (ref 3.87–5.11)
RDW: 15 % (ref 11.5–15.5)
WBC: 8.4 10*3/uL (ref 4.0–10.5)
nRBC: 0 % (ref 0.0–0.2)

## 2024-01-26 LAB — COMPREHENSIVE METABOLIC PANEL WITH GFR
ALT: 160 U/L — ABNORMAL HIGH (ref 0–44)
AST: 138 U/L — ABNORMAL HIGH (ref 15–41)
Albumin: 3.1 g/dL — ABNORMAL LOW (ref 3.5–5.0)
Alkaline Phosphatase: 94 U/L (ref 38–126)
Anion gap: 7 (ref 5–15)
BUN: 17 mg/dL (ref 8–23)
CO2: 20 mmol/L — ABNORMAL LOW (ref 22–32)
Calcium: 8.3 mg/dL — ABNORMAL LOW (ref 8.9–10.3)
Chloride: 107 mmol/L (ref 98–111)
Creatinine, Ser: 1.03 mg/dL — ABNORMAL HIGH (ref 0.44–1.00)
GFR, Estimated: 53 mL/min — ABNORMAL LOW (ref >60)
Glucose, Bld: 92 mg/dL (ref 70–99)
Potassium: 4.1 mmol/L (ref 3.5–5.1)
Sodium: 134 mmol/L — ABNORMAL LOW (ref 135–145)
Total Bilirubin: 2.8 mg/dL — ABNORMAL HIGH (ref 0.0–1.2)
Total Protein: 6.3 g/dL — ABNORMAL LOW (ref 6.5–8.1)

## 2024-01-26 LAB — GLUCOSE, CAPILLARY
Glucose-Capillary: 84 mg/dL (ref 70–99)
Glucose-Capillary: 121 mg/dL — ABNORMAL HIGH (ref 70–99)
Glucose-Capillary: 127 mg/dL — ABNORMAL HIGH (ref 70–99)

## 2024-01-26 LAB — NO BLOOD PRODUCTS

## 2024-01-26 SURGERY — LAPAROSCOPIC CHOLECYSTECTOMY WITH INTRAOPERATIVE CHOLANGIOGRAM
Anesthesia: General

## 2024-01-26 MED ORDER — ACETAMINOPHEN 500 MG PO TABS
1000.0000 mg | ORAL_TABLET | Freq: Once | ORAL | Status: AC
  Administered 2024-01-26: 1000 mg via ORAL
  Filled 2024-01-26: qty 2

## 2024-01-26 MED ORDER — ONDANSETRON HCL 4 MG/2ML IJ SOLN
INTRAMUSCULAR | Status: DC | PRN
  Administered 2024-01-26: 4 mg via INTRAVENOUS

## 2024-01-26 MED ORDER — FENTANYL CITRATE PF 50 MCG/ML IJ SOSY: 25.0000 ug | PREFILLED_SYRINGE | INTRAMUSCULAR | Status: DC | PRN

## 2024-01-26 MED ORDER — LACTATED RINGERS IV SOLN: INTRAVENOUS | Status: DC | PRN

## 2024-01-26 MED ORDER — DEXAMETHASONE SODIUM PHOSPHATE 10 MG/ML IJ SOLN
INTRAMUSCULAR | Status: AC
  Filled 2024-01-26: qty 3

## 2024-01-26 MED ORDER — ONDANSETRON HCL 4 MG/2ML IJ SOLN
INTRAMUSCULAR | Status: AC
  Filled 2024-01-26: qty 6

## 2024-01-26 MED ORDER — AMLODIPINE BESYLATE 10 MG PO TABS
5.0000 mg | ORAL_TABLET | Freq: Every day | ORAL | Status: DC
  Administered 2024-01-26 – 2024-01-27 (×2): 5 mg via ORAL
  Filled 2024-01-26 (×2): qty 1

## 2024-01-26 MED ORDER — LIDOCAINE HCL (CARDIAC) PF 100 MG/5ML IV SOSY
PREFILLED_SYRINGE | INTRAVENOUS | Status: DC | PRN
  Administered 2024-01-26: 40 mg via INTRAVENOUS

## 2024-01-26 MED ORDER — OXYCODONE HCL 5 MG PO TABS
5.0000 mg | ORAL_TABLET | Freq: Four times a day (QID) | ORAL | Status: DC | PRN
  Administered 2024-01-26: 5 mg via ORAL
  Filled 2024-01-26: qty 1

## 2024-01-26 MED ORDER — FENTANYL CITRATE (PF) 100 MCG/2ML IJ SOLN
INTRAMUSCULAR | Status: AC
  Filled 2024-01-26: qty 2

## 2024-01-26 MED ORDER — PROPOFOL 10 MG/ML IV BOLUS
INTRAVENOUS | Status: DC | PRN
  Administered 2024-01-26: 80 mg via INTRAVENOUS

## 2024-01-26 MED ORDER — 0.9 % SODIUM CHLORIDE (POUR BTL) OPTIME
TOPICAL | Status: DC | PRN
  Administered 2024-01-26: 1000 mL

## 2024-01-26 MED ORDER — FENTANYL CITRATE (PF) 100 MCG/2ML IJ SOLN
INTRAMUSCULAR | Status: DC | PRN
  Administered 2024-01-26 (×2): 25 ug via INTRAVENOUS

## 2024-01-26 MED ORDER — SUGAMMADEX SODIUM 200 MG/2ML IV SOLN
INTRAVENOUS | Status: DC | PRN
  Administered 2024-01-26: 200 mg via INTRAVENOUS

## 2024-01-26 MED ORDER — LACTATED RINGERS IR SOLN
Status: DC | PRN
  Administered 2024-01-26: 1000 mL

## 2024-01-26 MED ORDER — ROCURONIUM BROMIDE 100 MG/10ML IV SOLN
INTRAVENOUS | Status: DC | PRN
Start: 2024-01-26 — End: 2024-01-26
  Administered 2024-01-26: 50 mg via INTRAVENOUS

## 2024-01-26 MED ORDER — PHENYLEPHRINE 80 MCG/ML (10ML) SYRINGE FOR IV PUSH (FOR BLOOD PRESSURE SUPPORT)
PREFILLED_SYRINGE | INTRAVENOUS | Status: DC | PRN
  Administered 2024-01-26: 80 ug via INTRAVENOUS

## 2024-01-26 MED ORDER — BUPIVACAINE-EPINEPHRINE (PF) 0.25% -1:200000 IJ SOLN
INTRAMUSCULAR | Status: AC
  Filled 2024-01-26: qty 30

## 2024-01-26 MED ORDER — BUPIVACAINE-EPINEPHRINE 0.25% -1:200000 IJ SOLN
INTRAMUSCULAR | Status: DC | PRN
  Administered 2024-01-26: 10 mL

## 2024-01-26 MED ORDER — OXYCODONE HCL 5 MG/5ML PO SOLN: 5.0000 mg | Freq: Once | ORAL | Status: DC | PRN

## 2024-01-26 MED ORDER — INSULIN ASPART 100 UNIT/ML IJ SOLN
0.0000 [IU] | Freq: Three times a day (TID) | INTRAMUSCULAR | Status: DC
Start: 2024-01-26 — End: 2024-01-27

## 2024-01-26 MED ORDER — OXYCODONE HCL 5 MG PO TABS: 5.0000 mg | ORAL_TABLET | Freq: Once | ORAL | Status: DC | PRN

## 2024-01-26 MED ORDER — AMISULPRIDE (ANTIEMETIC) 5 MG/2ML IV SOLN: 10.0000 mg | Freq: Once | INTRAVENOUS | Status: DC | PRN

## 2024-01-26 SURGICAL SUPPLY — 28 items
CABLE HIGH FREQUENCY MONO STRZ (ELECTRODE) ×1 IMPLANT
CHLORAPREP W/TINT 26 (MISCELLANEOUS) ×1 IMPLANT
CLIP APPLIE ROT 10 11.4 M/L (STAPLE) ×1 IMPLANT
COVER MAYO STAND XLG (MISCELLANEOUS) IMPLANT
COVER SURGICAL LIGHT HANDLE (MISCELLANEOUS) ×1 IMPLANT
DERMABOND ADVANCED .7 DNX12 (GAUZE/BANDAGES/DRESSINGS) ×1 IMPLANT
DRAPE C-ARM 42X120 X-RAY (DRAPES) IMPLANT
ELECT REM PT RETURN 15FT ADLT (MISCELLANEOUS) ×1 IMPLANT
GLOVE INDICATOR 8.0 STRL GRN (GLOVE) ×1 IMPLANT
GLOVE SS BIOGEL STRL SZ 8 (GLOVE) ×1 IMPLANT
GOWN STRL REUS W/ TWL XL LVL3 (GOWN DISPOSABLE) ×2 IMPLANT
HEMOSTAT SURGICEL 4X8 (HEMOSTASIS) IMPLANT
IRRIGATION SUCT STRKRFLW 2 WTP (MISCELLANEOUS) ×1 IMPLANT
KIT BASIN OR (CUSTOM PROCEDURE TRAY) ×1 IMPLANT
KIT TURNOVER KIT A (KITS) IMPLANT
POUCH RETRIEVAL ECOSAC 10 (ENDOMECHANICALS) ×1 IMPLANT
SCISSORS LAP 5X35 DISP (ENDOMECHANICALS) ×1 IMPLANT
SET CHOLANGIOGRAPH MIX (MISCELLANEOUS) IMPLANT
SET TUBE SMOKE EVAC HIGH FLOW (TUBING) ×1 IMPLANT
SLEEVE Z-THREAD 5X100MM (TROCAR) ×1 IMPLANT
SPIKE FLUID TRANSFER (MISCELLANEOUS) ×1 IMPLANT
SUT MNCRL AB 4-0 PS2 18 (SUTURE) ×1 IMPLANT
SUT VICRYL 0 UR6 27IN ABS (SUTURE) IMPLANT
TOWEL OR 17X26 10 PK STRL BLUE (TOWEL DISPOSABLE) ×1 IMPLANT
TRAY LAPAROSCOPIC (CUSTOM PROCEDURE TRAY) ×1 IMPLANT
TROCAR 11X100 Z THREAD (TROCAR) ×1 IMPLANT
TROCAR BALLN 12MMX100 BLUNT (TROCAR) ×1 IMPLANT
TROCAR Z-THREAD OPTICAL 5X100M (TROCAR) ×1 IMPLANT

## 2024-01-26 NOTE — Anesthesia Preprocedure Evaluation (Addendum)
 Anesthesia Evaluation  Patient identified by MRN, date of birth, ID band Patient awake    Reviewed: Allergy & Precautions, NPO status , Patient's Chart, lab work & pertinent test results  Airway Mallampati: II  TM Distance: >3 FB Neck ROM: Full    Dental  (+) Edentulous Upper, Dental Advisory Given, Missing, Chipped, Poor Dentition   Pulmonary former smoker   Pulmonary exam normal breath sounds clear to auscultation       Cardiovascular hypertension, Pt. on medications + DOE  Normal cardiovascular exam Rhythm:Regular Rate:Normal  TTE 2025 1. Left ventricular ejection fraction, by estimation, is 60 to 65%. The  left ventricle has normal function. The left ventricle has no regional  wall motion abnormalities. There is mild left ventricular hypertrophy of  the basal-septal segment. Left  ventricular diastolic parameters are consistent with Grade I diastolic  dysfunction (impaired relaxation).   2. Right ventricular systolic function is normal. The right ventricular  size is normal. There is moderately elevated pulmonary artery systolic  pressure. The estimated right ventricular systolic pressure is 55.7 mmHg.   3. The mitral valve is normal in structure. Trivial mitral valve  regurgitation. No evidence of mitral stenosis.   4. The aortic valve is tricuspid. Aortic valve regurgitation is trivial.  Aortic valve sclerosis/calcification is present, without any evidence of  aortic stenosis. Aortic regurgitation PHT measures 553 msec. Aortic valve  area, by VTI measures 1.78 cm.  Aortic valve mean gradient measures 6.0 mmHg. Aortic valve Vmax measures  1.62 m/s.   5. The inferior vena cava is dilated in size with <50% respiratory  variability, suggesting right atrial pressure of 15 mmHg.     Neuro/Psych negative neurological ROS  negative psych ROS   GI/Hepatic negative GI ROS, Neg liver ROS,,,  Endo/Other  diabetes, Type 2     Renal/GU Renal InsufficiencyRenal diseaseLab Results      Component                Value               Date                      NA                       134 (L)             01/26/2024                CL                       107                 01/26/2024                K                        4.1                 01/26/2024                CO2                      20 (L)              01/26/2024                BUN  17                  01/26/2024                CREATININE               1.03 (H)            01/26/2024                GFRNONAA                 53 (L)              01/26/2024                CALCIUM                  8.3 (L)             01/26/2024                PHOS                     3.0                 01/25/2024                ALBUMIN                  3.1 (L)             01/26/2024                GLUCOSE                  92                  01/26/2024             negative genitourinary   Musculoskeletal  (+) Arthritis ,    Abdominal   Peds  Hematology  (+) Blood dyscrasia, anemia , REFUSES BLOOD PRODUCTSLab Results      Component                Value               Date                      WBC                      8.4                 01/26/2024                HGB                      9.0 (L)             01/26/2024                HCT                      28.7 (L)            01/26/2024                MCV                      93.5  01/26/2024                PLT                      133 (L)             01/26/2024              Anesthesia Other Findings   Reproductive/Obstetrics                             Anesthesia Physical Anesthesia Plan  ASA: 3  Anesthesia Plan: General   Post-op Pain Management: Tylenol  PO (pre-op)*   Induction: Intravenous  PONV Risk Score and Plan: 3 and Dexamethasone, Ondansetron and Treatment may vary due to age or medical condition  Airway Management Planned: Oral  ETT  Additional Equipment:   Intra-op Plan:   Post-operative Plan: Extubation in OR  Informed Consent: I have reviewed the patients History and Physical, chart, labs and discussed the procedure including the risks, benefits and alternatives for the proposed anesthesia with the patient or authorized representative who has indicated his/her understanding and acceptance.     Dental advisory given  Plan Discussed with: CRNA  Anesthesia Plan Comments:        Anesthesia Quick Evaluation

## 2024-01-26 NOTE — Progress Notes (Signed)
 PROGRESS NOTE    Crystal Richard  GNF:[id number] DOB: May 30, 1936 DOA: 01/25/2024 PCP: Lieutenant Reese., MD     Brief Narrative:  Crystal Richard is a 88 y.o. female with medical history significant of hyperlipidemia, grade 1 diastolic dysfunction, hypertension, urinary incontinence, stage 3a CKD, postural dizziness and presyncope, arthralgias, osteoarthritis, osteoporosis, adjustment disorder with mixed anxiety and depressed mood, vitamin B12 deficiency who presented to the emergency department with complaints of right upper quadrant abdominal and right flank pain associated with fever.  Workup concerning for acute cholecystitis.  General surgery was consulted.  New events last 24 hours / Subjective: Feeling well this morning, no nausea, vomiting or abdominal pain.  She is tender to palpation in the right upper quadrant.  Lap chole scheduled for today.  Assessment & Plan:   Principal Problem:   Gallbladder dilatation Active Problems:   Stage 3a chronic kidney disease (HCC)   Vitamin B12 deficiency   Hypokalemia   Benign hypertension   Type 2 diabetes mellitus with hyperglycemia (HCC)   Grade I diastolic dysfunction   Dyspnea on exertion   Acute cholecystitis   Fever   Preop cardiovascular exam   Anemia   Acute cholecystitis - Lap chole today with general surgery - Appreciate cardiology for preop clearance  Grade 1 diastolic dysfunction - Stable  CKD stage IIIa - Stable.  Baseline creatinine 1.1  Hypertension - Losartan   Diabetes mellitus type 2 - Sliding scale insulin  Chronic normocytic anemia - Baseline hemoglobin 10.8.  Previous workup in 2024 revealed normal iron panel  - Iron studies ordered, B12, folate - Jehovah's Witness and blood product refusal   DVT prophylaxis:  SCDs Start: 01/25/24 0955  Code Status: Full code Family Communication: At bedside, Archie Bearded is her daughter and POA Disposition Plan: Home Status is: Inpatient Remains inpatient  appropriate because: Surgery today    Antimicrobials:  Anti-infectives (From admission, onward)    Start     Dose/Rate Route Frequency Ordered Stop   01/26/24 0600  [MAR Hold]  cefTRIAXone (ROCEPHIN) 2 g in sodium chloride  0.9 % 100 mL IVPB        (MAR Hold since Fri 01/26/2024 at 0934.Hold Reason: Transfer to a Procedural area)   2 g 200 mL/hr over 30 Minutes Intravenous Every 24 hours 01/25/24 0958     01/25/24 1015  [MAR Hold]  metroNIDAZOLE (FLAGYL) IVPB 500 mg        (MAR Hold since Fri 01/26/2024 at 0934.Hold Reason: Transfer to a Procedural area)   500 mg 100 mL/hr over 60 Minutes Intravenous 2 times daily 01/25/24 0958     01/25/24 0715  cefTRIAXone (ROCEPHIN) 2 g in sodium chloride  0.9 % 100 mL IVPB        2 g 200 mL/hr over 30 Minutes Intravenous  Once 01/25/24 0714 01/25/24 0805        Objective: Vitals:   01/26/24 0555 01/26/24 0652 01/26/24 0945 01/26/24 1002  BP: (!) 166/73 (!) 150/57  (!) 182/58  Pulse: 65 70  72  Resp: 15   16  Temp: 99.2 F (37.3 C)   98.4 F (36.9 C)  TempSrc: Oral   Oral  SpO2: 94%   99%  Weight:    52.3 kg  Height:   5\' 4"  (1.626 m)     Intake/Output Summary (Last 24 hours) at 01/26/2024 1052 Last data filed at 01/26/2024 0732 Gross per 24 hour  Intake 2283.36 ml  Output --  Net 2283.36 ml   [employer]  01/25/24 0320 01/26/24 1002  Weight: 49.9 kg 52.3 kg    Examination:  General exam: Appears calm and comfortable  Respiratory system: Clear to auscultation. Respiratory effort normal. No respiratory distress. No conversational dyspnea.  Cardiovascular system: S1 & S2 heard, RRR. No murmurs. No pedal edema. Gastrointestinal system: Abdomen is nondistended, soft and mildly tender to palpation right upper quadrant Central nervous system: Alert and oriented. No focal neurological deficits. Speech clear.  Extremities: Symmetric in appearance  Skin: No rashes, lesions or ulcers on exposed skin  Psychiatry: Judgement and insight  appear normal. Mood & affect appropriate.   Data Reviewed: I have personally reviewed following labs and imaging studies  CBC: Recent Labs  Lab 01/25/24 0328 01/26/24 0335  WBC 8.5 8.4  NEUTROABS 7.6  --   HGB 10.6* 9.0*  HCT 32.9* 28.7*  MCV 88.2 93.5  PLT 222 133*   Basic Metabolic Panel: Recent Labs  Lab 01/25/24 0328 01/25/24 0637 01/26/24 0335  NA 140  --  134*  K 3.3*  --  4.1  CL 103  --  107  CO2 24  --  20*  GLUCOSE 209*  --  92  BUN 15  --  17  CREATININE 0.89  --  1.03*  CALCIUM 9.6  --  8.3*  MG  --  2.0  --   PHOS  --  3.0  --    GFR: Estimated Creatinine Clearance: 33 mL/min (A) (by C-G formula based on SCr of 1.03 mg/dL (H)). Liver Function Tests: Recent Labs  Lab 01/25/24 0328 01/26/24 0335  AST 181* 138*  ALT 76* 160*  ALKPHOS 104 94  BILITOT 0.8 2.8*  PROT 7.4 6.3*  ALBUMIN 4.3 3.1*   Recent Labs  Lab 01/25/24 0328  LIPASE 55*   No results for input(s): "AMMONIA" in the last 168 hours. Coagulation Profile: No results for input(s): "INR", "PROTIME" in the last 168 hours. Cardiac Enzymes: No results for input(s): "CKTOTAL", "CKMB", "CKMBINDEX", "TROPONINI" in the last 168 hours. BNP (last 3 results) No results for input(s): "PROBNP" in the last 8760 hours. HbA1C: No results for input(s): "HGBA1C" in the last 72 hours. CBG: Recent Labs  Lab 01/25/24 1016 01/25/24 1227 01/25/24 1709 01/25/24 2353 01/26/24 0557  GLUCAP 127* 136* 120* 103* 84   Lipid Profile: No results for input(s): "CHOL", "HDL", "LDLCALC", "TRIG", "CHOLHDL", "LDLDIRECT" in the last 72 hours. Thyroid Function Tests: No results for input(s): "TSH", "T4TOTAL", "FREET4", "T3FREE", "THYROIDAB" in the last 72 hours. Anemia Panel: No results for input(s): "VITAMINB12", "FOLATE", "FERRITIN", "TIBC", "IRON", "RETICCTPCT" in the last 72 hours. Sepsis Labs: No results for input(s): "PROCALCITON", "LATICACIDVEN" in the last 168 hours.  No results found for this or any  previous visit (from the past 240 hours).    Radiology Studies: ECHOCARDIOGRAM COMPLETE Result Date: 01/25/2024    ECHOCARDIOGRAM REPORT   Patient Name:   Crystal Richard Date of Exam: 01/25/2024 Medical Rec #:  [id number]      Height:       64.0 in Accession #:    [id number]     Weight:       110.0 lb Date of Birth:  10-Oct-1935       BSA:          1.517 m Patient Age:    87 years       BP:           150/50 mmHg Patient Gender: F  HR:           81 bpm. Exam Location:  Inpatient Procedure: 2D Echo, Cardiac Doppler and Color Doppler (Both Spectral and Color            Flow Doppler were utilized during procedure). Indications:    Abnormal ECG, preoperative evaluation  History:        Patient has prior history of Echocardiogram examinations, most                 recent 04/23/2017. Risk Factors:Hypertension, Dyslipidemia and                 Diabetes. CKD stage 3.  Sonographer:    Juanita Shaw Referring Phys: [id number] DAVID MANUEL ORTIZ IMPRESSIONS  1. Left ventricular ejection fraction, by estimation, is 60 to 65%. The left ventricle has normal function. The left ventricle has no regional wall motion abnormalities. There is mild left ventricular hypertrophy of the basal-septal segment. Left ventricular diastolic parameters are consistent with Grade I diastolic dysfunction (impaired relaxation).  2. Right ventricular systolic function is normal. The right ventricular size is normal. There is moderately elevated pulmonary artery systolic pressure. The estimated right ventricular systolic pressure is 55.7 mmHg.  3. The mitral valve is normal in structure. Trivial mitral valve regurgitation. No evidence of mitral stenosis.  4. The aortic valve is tricuspid. Aortic valve regurgitation is trivial. Aortic valve sclerosis/calcification is present, without any evidence of aortic stenosis. Aortic regurgitation PHT measures 553 msec. Aortic valve area, by VTI measures 1.78 cm. Aortic valve mean gradient measures 6.0  mmHg. Aortic valve Vmax measures 1.62 m/s.  5. The inferior vena cava is dilated in size with <50% respiratory variability, suggesting right atrial pressure of 15 mmHg. FINDINGS  Left Ventricle: Left ventricular ejection fraction, by estimation, is 60 to 65%. The left ventricle has normal function. The left ventricle has no regional wall motion abnormalities. The left ventricular internal cavity size was normal in size. There is  mild left ventricular hypertrophy of the basal-septal segment. Left ventricular diastolic parameters are consistent with Grade I diastolic dysfunction (impaired relaxation). Normal left ventricular filling pressure. Right Ventricle: The right ventricular size is normal. No increase in right ventricular wall thickness. Right ventricular systolic function is normal. There is moderately elevated pulmonary artery systolic pressure. The tricuspid regurgitant velocity is 3.19 m/s, and with an assumed right atrial pressure of 15 mmHg, the estimated right ventricular systolic pressure is 55.7 mmHg. Left Atrium: Left atrial size was normal in size. Right Atrium: Right atrial size was normal in size. Pericardium: There is no evidence of pericardial effusion. Mitral Valve: The mitral valve is normal in structure. Trivial mitral valve regurgitation. No evidence of mitral valve stenosis. MV peak gradient, 8.2 mmHg. The mean mitral valve gradient is 3.0 mmHg. Tricuspid Valve: The tricuspid valve is normal in structure. Tricuspid valve regurgitation is trivial. No evidence of tricuspid stenosis. Aortic Valve: The aortic valve is tricuspid. Aortic valve regurgitation is trivial. Aortic regurgitation PHT measures 553 msec. Aortic valve sclerosis/calcification is present, without any evidence of aortic stenosis. Aortic valve mean gradient measures 6.0 mmHg. Aortic valve peak gradient measures 10.5 mmHg. Aortic valve area, by VTI measures 1.78 cm. Pulmonic Valve: The pulmonic valve was normal in structure.  Pulmonic valve regurgitation is not visualized. No evidence of pulmonic stenosis. Aorta: The aortic root is normal in size and structure. Venous: The inferior vena cava is dilated in size with less than 50% respiratory variability, suggesting right atrial pressure of  15 mmHg. IAS/Shunts: No atrial level shunt detected by color flow Doppler.  LEFT VENTRICLE PLAX 2D LVIDd:         3.90 cm      Diastology LVIDs:         2.50 cm      LV e' medial:    6.42 cm/s LV PW:         0.90 cm      LV E/e' medial:  14.1 LV IVS:        1.10 cm      LV e' lateral:   9.03 cm/s LVOT diam:     1.80 cm      LV E/e' lateral: 10.0 LV SV:         64 LV SV Index:   42 LVOT Area:     2.54 cm  LV Volumes (MOD) LV vol d, MOD A2C: 89.5 ml LV vol d, MOD A4C: 102.0 ml LV vol s, MOD A2C: 30.7 ml LV vol s, MOD A4C: 30.0 ml LV SV MOD A2C:     58.8 ml LV SV MOD A4C:     102.0 ml LV SV MOD BP:      66.9 ml RIGHT VENTRICLE             IVC RV Basal diam:  3.50 cm     IVC diam: 2.30 cm RV Mid diam:    2.30 cm RV S prime:     14.10 cm/s TAPSE (M-mode): 2.7 cm LEFT ATRIUM             Index        RIGHT ATRIUM           Index LA diam:        3.10 cm 2.04 cm/m   RA Area:     15.80 cm LA Vol (A2C):   61.9 ml 40.79 ml/m  RA Volume:   40.10 ml  26.43 ml/m LA Vol (A4C):   32.6 ml 21.48 ml/m LA Biplane Vol: 49.2 ml 32.42 ml/m  AORTIC VALVE                     PULMONIC VALVE AV Area (Vmax):    2.14 cm      PV Vmax:       0.98 m/s AV Area (Vmean):   1.66 cm      PV Peak grad:  3.9 mmHg AV Area (VTI):     1.78 cm AV Vmax:           162.00 cm/s AV Vmean:          120.000 cm/s AV VTI:            0.357 m AV Peak Grad:      10.5 mmHg AV Mean Grad:      6.0 mmHg LVOT Vmax:         136.00 cm/s LVOT Vmean:        78.400 cm/s LVOT VTI:          0.250 m LVOT/AV VTI ratio: 0.70 AI PHT:            553 msec  AORTA Ao Root diam: 2.90 cm Ao Asc diam:  2.40 cm MITRAL VALVE                TRICUSPID VALVE MV Area (PHT): 3.81 cm     TR Peak grad:   40.7 mmHg MV Area VTI:    2.06 cm  TR Vmax:        319.00 cm/s MV Peak grad:  8.2 mmHg MV Mean grad:  3.0 mmHg     SHUNTS MV Vmax:       1.43 m/s     Systemic VTI:  0.25 m MV Vmean:      77.9 cm/s    Systemic Diam: 1.80 cm MV Decel Time: 199 msec MV E velocity: 90.40 cm/s MV A velocity: 125.00 cm/s MV E/A ratio:  0.72 Gaylyn Keas MD Electronically signed by Gaylyn Keas MD Signature Date/Time: 01/25/2024/12:48:45 PM    Final    US  Abdomen Limited RUQ (LIVER/GB) Result Date: 01/25/2024 CLINICAL DATA:  88 year old female with right abdominal pain, nausea vomiting, suspicious CT appearance of the gallbladder this morning. EXAM: ULTRASOUND ABDOMEN LIMITED RIGHT UPPER QUADRANT COMPARISON:  CT Abdomen and Pelvis 0502 hours. FINDINGS: Gallbladder: Similar gallbladder distension. Small (4 mm) areas of layering sludge or nonshadowing stones (image 11). Gallbladder wall thickness is at the upper limits of normal, 3 mm. No sonographic Murphy sign elicited. No pericholecystic fluid. Common bile duct: Diameter: 2 mm, normal. Liver: No focal lesion identified. Within normal limits in parenchymal echogenicity. Portal vein is patent on color Doppler imaging with normal direction of blood flow towards the liver. Other: Negative visible right kidney. IMPRESSION: 1. Distended gallbladder with small stones and/or foci of sludge. Wall thickness at the upper limits of normal. But absent sonographic Abigail Abler sign argues against acute cholecystitis. 2. Negative ultrasound appearance of the liver and bile ducts. Electronically Signed   By: Marlise Simpers M.D.   On: 01/25/2024 06:48   CT ABDOMEN PELVIS W CONTRAST Result Date: 01/25/2024 CLINICAL DATA:  88 year old female with right lower abdominal pain. Nausea vomiting. EXAM: CT ABDOMEN AND PELVIS WITH CONTRAST TECHNIQUE: Multidetector CT imaging of the abdomen and pelvis was performed using the standard protocol following bolus administration of intravenous contrast. RADIATION DOSE REDUCTION: This exam was  performed according to the departmental dose-optimization program which includes automated exposure control, adjustment of the mA and/or kV according to patient size and/or use of iterative reconstruction technique. CONTRAST:  100mL OMNIPAQUE IOHEXOL 300 MG/ML  SOLN COMPARISON:  Report of abdomen ultrasound 06/26/1994 (no images available). FINDINGS: Lower chest: Curvilinear bilateral lower lobe lung base scarring. Heart size at the upper limits of normal. No pericardial or pleural effusion. Hepatobiliary: Gallbladder somewhat distended but otherwise within normal limits. Bile ducts appear within normal limits for age. Liver enhancement is within normal limits; subcentimeter but circumscribed low-density area in the inferior right hepatic lobe (coronal image 59) is compatible with benign cyst (no follow-up imaging recommended). Pancreas: Negative. Spleen: Negative.  Splenule, normal variant. Adrenals/Urinary Tract: Normal adrenal glands. Nonobstructed kidneys with symmetric renal enhancement and contrast excretion. Diminutive ureters. Bladder obscured by hip arthroplasty artifact. Stomach/Bowel: Rectum and distal sigmoid colon obscured by hip arthroplasty artifact. Retained stool throughout redundant transverse colon, splenic flexure, descending colon and proximal sigmoid. Less pronounced retained stool in the right colon. Normal appendix is identified (coronal images 52 and 65. No large bowel inflammation identified. Terminal ileum is decompressed. No dilated small bowel. Small volume fluid in the gastric fundus. Decompressed distal stomach and duodenum. No pneumoperitoneum or free fluid identified. No mesenteric inflammation identified. Vascular/Lymphatic: Advanced Calcified aortic atherosclerosis. Major arterial structures in the abdomen and pelvis remain patent despite bulky calcified atherosclerosis. Abdominal aortic size remains normal. Portal venous system is patent. No lymphadenopathy. Reproductive:  Partially obscured by hip arthroplasty artifact, otherwise negative. Other: Hip arthroplasty streak artifact limits  pelvic visualization. No free fluid is evident. Musculoskeletal: Bilateral hip arthroplasty with streak artifact. Lumbar spine degeneration including mild grade 1 spondylolisthesis at L4-L5 with vacuum disc. Bulky lumbar facet arthropathy greater on the right. No acute osseous abnormality identified. IMPRESSION: 1. Normal appendix. No acute or inflammatory process identified in the abdomen. But retained stool throughout redundant large bowel, consider constipation. 2. Bilateral hip arthroplasty artifact obscures most visualization of the pelvis. 3. Severe  Aortic Atherosclerosis (ICD10-I70.0). Electronically Signed   By: Marlise Simpers M.D.   On: 01/25/2024 05:20      Scheduled Meds:  insulin aspart  0-9 Units Subcutaneous TID WC   [MAR Hold] losartan   25 mg Oral Daily   Continuous Infusions:  [MAR Hold] cefTRIAXone (ROCEPHIN)  IV Stopped (01/26/24 0649)   [MAR Hold] metronidazole 500 mg (01/26/24 0900)     LOS: 1 day   Time spent: 35 minutes   Daren Eck, DO [facility] 01/26/2024, 10:52 AM   Available via Epic secure chat 7am-7pm After these hours, please refer to coverage provider listed on [email or web address]

## 2024-01-26 NOTE — Transfer of Care (Signed)
 Immediate Anesthesia Transfer of Care Note  Patient: Crystal Richard  Procedure(s) Performed: LAPAROSCOPIC CHOLECYSTECTOMY WITH INTRAOPERATIVE CHOLANGIOGRAM  Patient Location: PACU  Anesthesia Type:General  Level of Consciousness: awake and alert   Airway & Oxygen Therapy: Patient Spontanous Breathing and Patient connected to nasal cannula oxygen  Post-op Assessment: Report given to RN and Post -op Vital signs reviewed and stable  Post vital signs: Reviewed and stable  Last Vitals:  Vitals Value Taken Time  BP 202/59 01/26/24 1236  Temp    Pulse 63 01/26/24 1239  Resp 13 01/26/24 1239  SpO2 100 % 01/26/24 1239  Vitals shown include unfiled device data.  Last Pain:  Vitals:   01/26/24 1002  TempSrc: Oral  PainSc:          Complications: No notable events documented.

## 2024-01-26 NOTE — Progress Notes (Signed)
 Subjective/Chief Complaint: Patient with fever overnight.  Cardiology evaluating.  Still with abdominal pain.   Objective: Vital signs in last 24 hours: Temp:  [99 F (37.2 C)-101.3 F (38.5 C)] 99.2 F (37.3 C) (05/16 0555) Pulse Rate:  [63-83] 70 (05/16 0652) Resp:  [14-21] 15 (05/16 0555) BP: (128-166)/(45-73) 150/57 (05/16 0652) SpO2:  [93 %-98 %] 94 % (05/16 0555) Last BM Date : 01/24/24  Intake/Output from previous day: 05/15 0701 - 05/16 0700 In: 2015.1 [P.O.:240; I.V.:825.1; IV Piggyback:950] Out: -  Intake/Output this shift: Total I/O In: 268.2 [I.V.:156.9; IV Piggyback:111.3] Out: -   Abdomen: Tender right upper quadrant  Lab Results:  Recent Labs    01/25/24 0328 01/26/24 0335  WBC 8.5 8.4  HGB 10.6* 9.0*  HCT 32.9* 28.7*  PLT 222 133*   BMET Recent Labs    01/25/24 0328 01/26/24 0335  NA 140 134*  K 3.3* 4.1  CL 103 107  CO2 24 20*  GLUCOSE 209* 92  BUN 15 17  CREATININE 0.89 1.03*  CALCIUM 9.6 8.3*   PT/INR No results for input(s): "LABPROT", "INR" in the last 72 hours. ABG No results for input(s): "PHART", "HCO3" in the last 72 hours.  Invalid input(s): "PCO2", "PO2"  Studies/Results: ECHOCARDIOGRAM COMPLETE Result Date: 01/25/2024    ECHOCARDIOGRAM REPORT   Patient Name:   Crystal Richard Date of Exam: 01/25/2024 Medical Rec #:  [id number]      Height:       64.0 in Accession #:    [id number]     Weight:       110.0 lb Date of Birth:  1936/05/17       BSA:          1.517 m Patient Age:    87 years       BP:           150/50 mmHg Patient Gender: F              HR:           81 bpm. Exam Location:  Inpatient Procedure: 2D Echo, Cardiac Doppler and Color Doppler (Both Spectral and Color            Flow Doppler were utilized during procedure). Indications:    Abnormal ECG, preoperative evaluation  History:        Patient has prior history of Echocardiogram examinations, most                 recent 04/23/2017. Risk Factors:Hypertension,  Dyslipidemia and                 Diabetes. CKD stage 3.  Sonographer:    Juanita Shaw Referring Phys: [id number] DAVID MANUEL ORTIZ IMPRESSIONS  1. Left ventricular ejection fraction, by estimation, is 60 to 65%. The left ventricle has normal function. The left ventricle has no regional wall motion abnormalities. There is mild left ventricular hypertrophy of the basal-septal segment. Left ventricular diastolic parameters are consistent with Grade I diastolic dysfunction (impaired relaxation).  2. Right ventricular systolic function is normal. The right ventricular size is normal. There is moderately elevated pulmonary artery systolic pressure. The estimated right ventricular systolic pressure is 55.7 mmHg.  3. The mitral valve is normal in structure. Trivial mitral valve regurgitation. No evidence of mitral stenosis.  4. The aortic valve is tricuspid. Aortic valve regurgitation is trivial. Aortic valve sclerosis/calcification is present, without any evidence of aortic stenosis. Aortic regurgitation PHT measures 553 msec. Aortic valve area, by  VTI measures 1.78 cm. Aortic valve mean gradient measures 6.0 mmHg. Aortic valve Vmax measures 1.62 m/s.  5. The inferior vena cava is dilated in size with <50% respiratory variability, suggesting right atrial pressure of 15 mmHg. FINDINGS  Left Ventricle: Left ventricular ejection fraction, by estimation, is 60 to 65%. The left ventricle has normal function. The left ventricle has no regional wall motion abnormalities. The left ventricular internal cavity size was normal in size. There is  mild left ventricular hypertrophy of the basal-septal segment. Left ventricular diastolic parameters are consistent with Grade I diastolic dysfunction (impaired relaxation). Normal left ventricular filling pressure. Right Ventricle: The right ventricular size is normal. No increase in right ventricular wall thickness. Right ventricular systolic function is normal. There is moderately elevated  pulmonary artery systolic pressure. The tricuspid regurgitant velocity is 3.19 m/s, and with an assumed right atrial pressure of 15 mmHg, the estimated right ventricular systolic pressure is 55.7 mmHg. Left Atrium: Left atrial size was normal in size. Right Atrium: Right atrial size was normal in size. Pericardium: There is no evidence of pericardial effusion. Mitral Valve: The mitral valve is normal in structure. Trivial mitral valve regurgitation. No evidence of mitral valve stenosis. MV peak gradient, 8.2 mmHg. The mean mitral valve gradient is 3.0 mmHg. Tricuspid Valve: The tricuspid valve is normal in structure. Tricuspid valve regurgitation is trivial. No evidence of tricuspid stenosis. Aortic Valve: The aortic valve is tricuspid. Aortic valve regurgitation is trivial. Aortic regurgitation PHT measures 553 msec. Aortic valve sclerosis/calcification is present, without any evidence of aortic stenosis. Aortic valve mean gradient measures 6.0 mmHg. Aortic valve peak gradient measures 10.5 mmHg. Aortic valve area, by VTI measures 1.78 cm. Pulmonic Valve: The pulmonic valve was normal in structure. Pulmonic valve regurgitation is not visualized. No evidence of pulmonic stenosis. Aorta: The aortic root is normal in size and structure. Venous: The inferior vena cava is dilated in size with less than 50% respiratory variability, suggesting right atrial pressure of 15 mmHg. IAS/Shunts: No atrial level shunt detected by color flow Doppler.  LEFT VENTRICLE PLAX 2D LVIDd:         3.90 cm      Diastology LVIDs:         2.50 cm      LV e' medial:    6.42 cm/s LV PW:         0.90 cm      LV E/e' medial:  14.1 LV IVS:        1.10 cm      LV e' lateral:   9.03 cm/s LVOT diam:     1.80 cm      LV E/e' lateral: 10.0 LV SV:         64 LV SV Index:   42 LVOT Area:     2.54 cm  LV Volumes (MOD) LV vol d, MOD A2C: 89.5 ml LV vol d, MOD A4C: 102.0 ml LV vol s, MOD A2C: 30.7 ml LV vol s, MOD A4C: 30.0 ml LV SV MOD A2C:     58.8 ml  LV SV MOD A4C:     102.0 ml LV SV MOD BP:      66.9 ml RIGHT VENTRICLE             IVC RV Basal diam:  3.50 cm     IVC diam: 2.30 cm RV Mid diam:    2.30 cm RV S prime:     14.10 cm/s TAPSE (M-mode): 2.7 cm LEFT  ATRIUM             Index        RIGHT ATRIUM           Index LA diam:        3.10 cm 2.04 cm/m   RA Area:     15.80 cm LA Vol (A2C):   61.9 ml 40.79 ml/m  RA Volume:   40.10 ml  26.43 ml/m LA Vol (A4C):   32.6 ml 21.48 ml/m LA Biplane Vol: 49.2 ml 32.42 ml/m  AORTIC VALVE                     PULMONIC VALVE AV Area (Vmax):    2.14 cm      PV Vmax:       0.98 m/s AV Area (Vmean):   1.66 cm      PV Peak grad:  3.9 mmHg AV Area (VTI):     1.78 cm AV Vmax:           162.00 cm/s AV Vmean:          120.000 cm/s AV VTI:            0.357 m AV Peak Grad:      10.5 mmHg AV Mean Grad:      6.0 mmHg LVOT Vmax:         136.00 cm/s LVOT Vmean:        78.400 cm/s LVOT VTI:          0.250 m LVOT/AV VTI ratio: 0.70 AI PHT:            553 msec  AORTA Ao Root diam: 2.90 cm Ao Asc diam:  2.40 cm MITRAL VALVE                TRICUSPID VALVE MV Area (PHT): 3.81 cm     TR Peak grad:   40.7 mmHg MV Area VTI:   2.06 cm     TR Vmax:        319.00 cm/s MV Peak grad:  8.2 mmHg MV Mean grad:  3.0 mmHg     SHUNTS MV Vmax:       1.43 m/s     Systemic VTI:  0.25 m MV Vmean:      77.9 cm/s    Systemic Diam: 1.80 cm MV Decel Time: 199 msec MV E velocity: 90.40 cm/s MV A velocity: 125.00 cm/s MV E/A ratio:  0.72 Gaylyn Keas MD Electronically signed by Gaylyn Keas MD Signature Date/Time: 01/25/2024/12:48:45 PM    Final    US  Abdomen Limited RUQ (LIVER/GB) Result Date: 01/25/2024 CLINICAL DATA:  88 year old female with right abdominal pain, nausea vomiting, suspicious CT appearance of the gallbladder this morning. EXAM: ULTRASOUND ABDOMEN LIMITED RIGHT UPPER QUADRANT COMPARISON:  CT Abdomen and Pelvis 0502 hours. FINDINGS: Gallbladder: Similar gallbladder distension. Small (4 mm) areas of layering sludge or nonshadowing stones  (image 11). Gallbladder wall thickness is at the upper limits of normal, 3 mm. No sonographic Murphy sign elicited. No pericholecystic fluid. Common bile duct: Diameter: 2 mm, normal. Liver: No focal lesion identified. Within normal limits in parenchymal echogenicity. Portal vein is patent on color Doppler imaging with normal direction of blood flow towards the liver. Other: Negative visible right kidney. IMPRESSION: 1. Distended gallbladder with small stones and/or foci of sludge. Wall thickness at the upper limits of normal. But absent sonographic Abigail Abler sign argues against acute cholecystitis. 2. Negative ultrasound appearance of the  liver and bile ducts. Electronically Signed   By: Marlise Simpers M.D.   On: 01/25/2024 06:48   CT ABDOMEN PELVIS W CONTRAST Result Date: 01/25/2024 CLINICAL DATA:  88 year old female with right lower abdominal pain. Nausea vomiting. EXAM: CT ABDOMEN AND PELVIS WITH CONTRAST TECHNIQUE: Multidetector CT imaging of the abdomen and pelvis was performed using the standard protocol following bolus administration of intravenous contrast. RADIATION DOSE REDUCTION: This exam was performed according to the departmental dose-optimization program which includes automated exposure control, adjustment of the mA and/or kV according to patient size and/or use of iterative reconstruction technique. CONTRAST:  100mL OMNIPAQUE IOHEXOL 300 MG/ML  SOLN COMPARISON:  Report of abdomen ultrasound 06/26/1994 (no images available). FINDINGS: Lower chest: Curvilinear bilateral lower lobe lung base scarring. Heart size at the upper limits of normal. No pericardial or pleural effusion. Hepatobiliary: Gallbladder somewhat distended but otherwise within normal limits. Bile ducts appear within normal limits for age. Liver enhancement is within normal limits; subcentimeter but circumscribed low-density area in the inferior right hepatic lobe (coronal image 59) is compatible with benign cyst (no follow-up imaging  recommended). Pancreas: Negative. Spleen: Negative.  Splenule, normal variant. Adrenals/Urinary Tract: Normal adrenal glands. Nonobstructed kidneys with symmetric renal enhancement and contrast excretion. Diminutive ureters. Bladder obscured by hip arthroplasty artifact. Stomach/Bowel: Rectum and distal sigmoid colon obscured by hip arthroplasty artifact. Retained stool throughout redundant transverse colon, splenic flexure, descending colon and proximal sigmoid. Less pronounced retained stool in the right colon. Normal appendix is identified (coronal images 52 and 65. No large bowel inflammation identified. Terminal ileum is decompressed. No dilated small bowel. Small volume fluid in the gastric fundus. Decompressed distal stomach and duodenum. No pneumoperitoneum or free fluid identified. No mesenteric inflammation identified. Vascular/Lymphatic: Advanced Calcified aortic atherosclerosis. Major arterial structures in the abdomen and pelvis remain patent despite bulky calcified atherosclerosis. Abdominal aortic size remains normal. Portal venous system is patent. No lymphadenopathy. Reproductive: Partially obscured by hip arthroplasty artifact, otherwise negative. Other: Hip arthroplasty streak artifact limits pelvic visualization. No free fluid is evident. Musculoskeletal: Bilateral hip arthroplasty with streak artifact. Lumbar spine degeneration including mild grade 1 spondylolisthesis at L4-L5 with vacuum disc. Bulky lumbar facet arthropathy greater on the right. No acute osseous abnormality identified. IMPRESSION: 1. Normal appendix. No acute or inflammatory process identified in the abdomen. But retained stool throughout redundant large bowel, consider constipation. 2. Bilateral hip arthroplasty artifact obscures most visualization of the pelvis. 3. Severe  Aortic Atherosclerosis (ICD10-I70.0). Electronically Signed   By: Marlise Simpers M.D.   On: 01/25/2024 05:20    Anti-infectives: Anti-infectives (From  admission, onward)    Start     Dose/Rate Route Frequency Ordered Stop   01/26/24 0600  cefTRIAXone (ROCEPHIN) 2 g in sodium chloride  0.9 % 100 mL IVPB        2 g 200 mL/hr over 30 Minutes Intravenous Every 24 hours 01/25/24 0958     01/25/24 1015  metroNIDAZOLE (FLAGYL) IVPB 500 mg        500 mg 100 mL/hr over 60 Minutes Intravenous 2 times daily 01/25/24 0958     01/25/24 0715  cefTRIAXone (ROCEPHIN) 2 g in sodium chloride  0.9 % 100 mL IVPB        2 g 200 mL/hr over 30 Minutes Intravenous  Once 01/25/24 0714 01/25/24 0805       Assessment/Plan: Acute cholecystitis-bilirubin 2.8 today and possibility of common duct stone noted.  Recommend IOC during procedure.  Await cardiology clearance.  Plan will be for laparoscopic  cholecystectomy today if she is cleared by cardiology.\    The procedure has been discussed with the patient. Operative and non operative treatments have been discussed. Risks of surgery include bleeding, infection,  Common bile duct injury,  Injury to the stomach,liver, colon,small intestine, abdominal wall,  Diaphragm,  Major blood vessels,  And the need for an open procedure.  Other risks include worsening of medical problems, death,  DVT and pulmonary embolism, and cardiovascular events.   Medical options have also been discussed. The patient has been informed of long term expectations of surgery and non surgical options,  The patient agrees to proceed.      LOS: 1 day    Rodrigo Clara 01/26/2024 High complexity

## 2024-01-26 NOTE — Op Note (Signed)
 /Laparoscopic Cholecystectomy with IOC Procedure Note  Indications: This patient presents with symptomatic gallbladder disease and will undergo laparoscopic cholecystectomy.The procedure has been discussed with the patient. Operative and non operative treatments have been discussed. Risks of surgery include bleeding, infection,  Common bile duct injury,  Injury to the stomach,liver, colon,small intestine, abdominal wall,  Diaphragm,  Major blood vessels,  And the need for an open procedure.  Other risks include worsening of medical problems, death,  DVT and pulmonary embolism, and cardiovascular events.   Medical options have also been discussed. The patient has been informed of long term expectations of surgery and non surgical options,  The patient agrees to proceed.     Pre-operative Diagnosis: Calculus of gallbladder with acute cholecystitis, without mention of obstruction  Post-operative Diagnosis: Same  Surgeon: Rodrigo Clara  MD   Assistants: OR staff  Anesthesia: General endotracheal anesthesia and Local anesthesia 0.25.% bupivacaine  ASA Class: 1  Procedure Details  The patient was seen again in the Holding Room. The risks, benefits, complications, treatment options, and expected outcomes were discussed with the patient. The possibilities of reaction to medication, pulmonary aspiration, perforation of viscus, bleeding, recurrent infection, finding a normal gallbladder, the need for additional procedures, failure to diagnose a condition, the possible need to convert to an open procedure, and creating a complication requiring transfusion or operation were discussed with the patient. The patient and/or family concurred with the proposed plan, giving informed consent. The site of surgery properly noted/marked. The patient was taken to Operating Room, identified as Kambree Bir and the procedure verified as Laparoscopic Cholecystectomy with Intraoperative Cholangiograms. A Time Out was held  and the above information confirmed.  Prior to the induction of general anesthesia, antibiotic prophylaxis was administered. General endotracheal anesthesia was then administered and tolerated well. After the induction, the abdomen was prepped in the usual sterile fashion. The patient was positioned in the supine position with the left arm comfortably tucked, along with some reverse Trendelenburg.  Local anesthetic agent was injected into the skin near the umbilicus and an incision made. The midline fascia was incised and the Hasson technique was used to introduce a 12 mm port under direct vision. It was secured with a figure of eight Vicryl suture placed in the usual fashion. Pneumoperitoneum was then created with CO2 and tolerated well without any adverse changes in the patient's vital signs. Additional trocars were introduced under direct vision with an 11 mm trocar in the epigastrium and 2 5 mm trocars in the right upper quadrant. All skin incisions were infiltrated with a local anesthetic agent before making the incision and placing the trocars.   The gallbladder was identified, the fundus grasped and retracted cephalad. Adhesions were lysed bluntly and with the electrocautery where indicated, taking care not to injure any adjacent organs or viscus. The infundibulum was grasped and retracted laterally, exposing the peritoneum overlying the triangle of Calot. This was then divided and exposed in a blunt fashion. The cystic duct was clearly identified and bluntly dissected circumferentially. The junctions of the gallbladder, cystic duct and common bile duct were clearly identified prior to the division of any linear structure.   An incision was made in the cystic duct and the cholangiogram catheter introduced. The catheter was secured using an endoclip. The study showed no stones and good visualization of the distal and proximal biliary tree. The catheter was then removed.   The cystic duct was then   ligated with surgical clips  on the patient  side and  clipped on the gallbladder side and divided. The cystic artery was identified, dissected free, ligated with clips and divided as well. Posterior cystic artery clipped and divided.  The gallbladder was dissected from the liver bed in retrograde fashion with the electrocautery. The gallbladder was removed. The liver bed was irrigated and inspected. Hemostasis was achieved with the electrocautery. Copious irrigation was utilized and was repeatedly aspirated until clear all particulate matter. Hemostasis was achieved with no signs  Of bleeding or bile leakage.  Pneumoperitoneum was completely reduced after viewing removal of the trocars under direct vision. The wound was thoroughly irrigated and the fascia was then closed with a figure of eight suture; the skin was then closed with 4 O monocryl   and a sterile dressing  of Dermabond was applied.  Instrument, sponge, and needle counts were correct at closure and at the conclusion of the case.   Findings: Cholecystitis with Cholelithiasis  Estimated Blood Loss: Minimal         Drains: none          Total IV Fluids: per OR record         Specimens: Gallbladder           Complications: None; patient tolerated the procedure well.         Disposition: PACU - hemodynamically stable.         Condition: stable

## 2024-01-26 NOTE — Consult Note (Addendum)
 Cardiology Consultation   Patient ID: Crystal Richard MRN: [id number]; DOB: 24-Feb-1936  Admit date: 01/25/2024 Date of Consult: 01/26/2024  PCP:  Lieutenant Reese., MD   [facility] Providers Cardiologist:  Olinda Bertrand, DO (NEW)  Chief complaint: Abdominal pain. Reason of consult: Shortness of breath and preop risk stratification Requesting physician: Dr. Bonita Bussing.   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  History of Present Illness:   Crystal Richard is a 88 y.o. female who per chart review has not been seen previously by cardiology. She has been hospitalized multiple times for near syncope. [facility] for presyncope that showed LVEF 55-60%, mild aortic regurgitation, mild tricuspid regurgitation, mild pulmonary hypertension.   The patient presented to the emergency department on 01/25/24 for sudden onset right sided abdominal pain that radiated across her abdomen. On interview the patient reported that she walks at least a mile about 2-3 times every week. She often will get shortness of breath with over exertion but not at steady pace.  No chest pain w/ effort. Will often hold something to steady herself so she does not fall. Is also dizzy when standing up to fast. These symptoms typically happen about once every week and have been going on for at least a year. Has not worn a cardiac monitor before. Her symptoms when presenting to the ER this time were different as they also involved nausea, vomiting, and abdominal discomfort. Denies chest pain, orthopnea, PND, lower extremity edema, palpitations, hematuria, hematochezia. Blood pressure is normotensive at home. Reported some  possible melena in stool since presenting to the hospital.  Labs showed an elevated AST of 138 and an elevated ALT of 160. Total bilirubin was also elevated at 2.8. The patient is also anemic with a hemoglobin of 9.0 this appears to be down from baseline. CT abdomen and pelvis showed a normal appendix, a large amount of retained stool, and severe aortic athersclerosis. Abdominal ultrasound showed a distended gallbladder with small stones and a thickened wall but murphy sign was negative. The patient also had a fever of 101.3. Surgery was consulted and is planning a laparoscopic cholecystectomy.  EKG showed normal sinus rhythm with a rate of 71 and LVH.  An Echo was performed on 01/25/24 and showed LVEF 60-65%, no regional wall motion abnormalities, LVH of basal-septal segment, GIDD, moderately elevated pulmonary artery systolic pressure, RV systolic pressure estimated to be 55.76mmHg, and elevated right atrial pressure of about .  Past Medical History:  Diagnosis Date   Grade I diastolic dysfunction 01/25/2024   Hypercholesteremia    Hypertension    Stage 3a chronic kidney disease (HCC) 05/03/2019    Past Surgical History:  Procedure Laterality Date   JOINT REPLACEMENT     bil hips   TOTAL HIP ARTHROPLASTY       Home Medications:  Prior to Admission medications   Medication Sig Start Date End Date Taking? Authorizing Provider  acetaminophen  (TYLENOL ) 500 MG tablet Take 500 mg by mouth every 6 (six) hours as needed for headache, moderate pain (pain score 4-6) or mild pain (pain score 1-3) (pain).   Yes [provider]  Cyanocobalamin (VITAMIN B-12 PO) Take 1 capsule  by mouth daily.   Yes [provider]  losartan  (COZAAR ) 25 MG tablet Take 25 mg by mouth in the morning and at bedtime.   Yes [provider]  pravastatin  (PRAVACHOL ) 40 MG tablet Take 20 mg by mouth at bedtime. 01/27/17  Yes [provider]    Inpatient Medications: Scheduled  Meds:  acetaminophen   1,000 mg Oral Once   [MAR Hold] losartan   25 mg Oral Daily   Continuous Infusions:  [MAR Hold] cefTRIAXone (ROCEPHIN)  IV Stopped (01/26/24 0649)   [MAR Hold] metronidazole 500 mg (01/26/24 0900)   PRN Meds: [MAR Hold] acetaminophen  **OR** [MAR Hold] acetaminophen , [MAR Hold] fentaNYL (SUBLIMAZE) injection, [MAR Hold] hydrALAZINE , [MAR Hold] ondansetron **OR** [MAR Hold] ondansetron (ZOFRAN) IV, [MAR Hold] mouth rinse  Allergies:   No Known Allergies  Social History:   Social History   Socioeconomic History   Marital status: Widowed    Spouse name: Not on file   Number of children: Not on file   Years of education: Not on file   Highest education level: Not on file  Occupational History   Not on file  Tobacco Use   Smoking status: Former   Smokeless tobacco: Never  Substance and Sexual Activity   Alcohol use: No   Drug use: No   Sexual activity: Not on file  Other Topics Concern   Not on file  Social History Narrative   Not on file   Social Drivers of Health   Financial Resource Strain: Not on file  Food Insecurity: No Food Insecurity (01/25/2024)   Hunger Vital Sign    Worried About Running Out of Food in the Last Year: Never true    Ran Out of Food in the Last Year: Never true  Transportation Needs: No Transportation Needs (01/25/2024)   PRAPARE - Administrator, Civil Service (Medical): No    Lack of Transportation (Non-Medical): No  Physical Activity: Not on file  Stress: Not on file  Social Connections: Moderately Integrated (01/25/2024)   Social Connection and Isolation Panel [NHANES]    Frequency of Communication with Friends and Family: More than three times a week    Frequency of Social Gatherings with Friends and Family: More than three times a week    Attends Religious Services: More than 4 times per year    Active Member of [employer] or Organizations: Yes    Attends Banker Meetings: More than 4 times per year     Marital Status: Widowed  Intimate Partner Violence: Not At Risk (01/25/2024)   Humiliation, Afraid, Rape, and Kick questionnaire    Fear of Current or Ex-Partner: No    Emotionally Abused: No    Physically Abused: No    Sexually Abused: No    Family History:   History reviewed. No pertinent family history.   ROS:  Review of Systems  Cardiovascular:  Positive for dyspnea on exertion (chronic and stable w/ over exertion). Negative for chest pain, claudication, irregular heartbeat, leg swelling, near-syncope, orthopnea, palpitations, paroxysmal nocturnal dyspnea and syncope.  Respiratory:  Negative for shortness of breath.   Hematologic/Lymphatic: Negative for bleeding problem.  Gastrointestinal:  Positive for abdominal pain, nausea and vomiting.   Physical Exam/Data:   Vitals:   01/26/24 0555 01/26/24 0652 01/26/24 0945 01/26/24 1002  BP: (!) 166/73 (!) 150/57  (!) 182/58  Pulse: 65 70  72  Resp: 15   16  Temp: 99.2 F (37.3 C)   98.4 F (36.9 C)  TempSrc: Oral   Oral  SpO2: 94%   99%  Weight:    52.3 kg  Height:   5\' 4"  (1.626 m)     Intake/Output Summary (Last 24 hours) at 01/26/2024 1015 Last data filed at 01/26/2024 0732 Gross per 24 hour  Intake 2283.36 ml  Output --  Net 2283.36 ml      01/26/2024   10:02 AM 01/25/2024    3:20 AM 12/11/2021    4:57 PM  Last 3 Weights  Weight (lbs) 115 lb 6.4 oz 110 lb 130 lb  Weight (kg) 52.345 kg 49.896 kg 58.968 kg     Body mass index is 19.81 kg/m.  General:  Well nourished, well developed, in no acute distress HEENT: normal Neck: no JVD Vascular: carotid bruits present L>R; Distal pulses 2+ bilaterally Cardiac:  normal S1, S2; RRR; no murmur  Lungs:  clear to auscultation bilaterally, no wheezing, rhonchi or rales  Ext: no edema Musculoskeletal:  No deformities. Skin: warm and dry  Neuro:   no focal abnormalities noted Psych:  Normal affect   EKG:  The EKG was personally reviewed and demonstrates:  normal sinus  rhythm with a rate of 71 and LVH. Telemetry:  Telemetry was personally reviewed and demonstrates:  normal sinus rhythm is 70's and 80's  Relevant CV Studies:  Echo on 01/25/24 IMPRESSIONS     1. Left ventricular ejection fraction, by estimation, is 60 to 65%. The  left ventricle has normal function. The left ventricle has no regional  wall motion abnormalities. There is mild left ventricular hypertrophy of  the basal-septal segment. Left  ventricular diastolic parameters are consistent with Grade I diastolic  dysfunction (impaired relaxation).   2. Right ventricular systolic function is normal. The right ventricular  size is normal. There is moderately elevated pulmonary artery systolic  pressure. The estimated right ventricular systolic pressure is 55.7 mmHg.   3. The mitral valve is normal in structure. Trivial mitral valve  regurgitation. No evidence of mitral stenosis.   4. The aortic valve is tricuspid. Aortic valve regurgitation is trivial.  Aortic valve sclerosis/calcification is present, without any evidence of  aortic stenosis. Aortic regurgitation PHT measures 553 msec. Aortic valve  area, by VTI measures 1.78 cm.  Aortic valve mean gradient measures 6.0 mmHg. Aortic valve Vmax measures  1.62 m/s.   5. The inferior vena cava is dilated in size with <50% respiratory  variability, suggesting right atrial pressure of 15 mmHg.    Laboratory Data:  High Sensitivity Troponin:  No results for input(s): "TROPONINIHS" in the last 720 hours.   Chemistry Recent Labs  Lab 01/25/24 0328 01/25/24 0637 01/26/24 0335  NA 140  --  134*  K 3.3*  --  4.1  CL 103  --  107  CO2 24  --  20*  GLUCOSE 209*  --  92  BUN 15  --  17  CREATININE 0.89  --  1.03*  CALCIUM 9.6  --  8.3*  MG  --  2.0  --   GFRNONAA >60  --  53*  ANIONGAP 13  --  7    Recent Labs  Lab 01/25/24 0328 01/26/24 0335  PROT 7.4 6.3*  ALBUMIN 4.3 3.1*  AST 181* 138*  ALT 76* 160*  ALKPHOS 104 94   BILITOT 0.8 2.8*   Lipids No results for input(s): "CHOL", "TRIG", "HDL", "LABVLDL", "LDLCALC", "CHOLHDL" in the last 168 hours.  Hematology Recent Labs  Lab 01/25/24 [phone] 01/26/24 0335  WBC 8.5 8.4  RBC 3.73* 3.07*  HGB 10.6* 9.0*  HCT 32.9* 28.7*  MCV 88.2 93.5  MCH 28.4 29.3  MCHC 32.2 31.4  RDW 14.6 15.0  PLT 222 133*   Thyroid No results for input(s): "TSH", "FREET4" in the last 168 hours.  BNPNo results for input(s): "BNP", "PROBNP" in the last 168 hours.  DDimer No results for input(s): "DDIMER" in the last 168 hours.   Radiology/Studies:  ECHOCARDIOGRAM COMPLETE Result Date: 01/25/2024    ECHOCARDIOGRAM REPORT   Patient Name:   TABIATHA TIMBS Date of Exam: 01/25/2024 Medical Rec #:  [id number]      Height:       64.0 in Accession #:    [id number]     Weight:       110.0 lb Date of Birth:  [birth year]       BSA:          1.517 m Patient Age:    87 years       BP:           150/50 mmHg Patient Gender: F              HR:           81 bpm. Exam Location:  Inpatient Procedure: 2D Echo, Cardiac Doppler and Color Doppler (Both Spectral and Color            Flow Doppler were utilized during procedure). Indications:    Abnormal ECG, preoperative evaluation  History:        Patient has prior history of Echocardiogram examinations, most                 recent 04/23/2017. Risk Factors:Hypertension, Dyslipidemia and                 Diabetes. CKD stage 3.  Sonographer:    Juanita Shaw Referring Phys: [id number] DAVID MANUEL ORTIZ IMPRESSIONS  1. Left ventricular ejection fraction, by estimation, is 60 to 65%. The left ventricle has normal function. The left ventricle has no regional wall motion abnormalities. There is mild left ventricular hypertrophy of the basal-septal segment. Left ventricular diastolic parameters are consistent with Grade I diastolic dysfunction (impaired relaxation).  2. Right ventricular systolic function is normal. The right ventricular size is normal. There is moderately  elevated pulmonary artery systolic pressure. The estimated right ventricular systolic pressure is 55.7 mmHg.  3. The mitral valve is normal in structure. Trivial mitral valve regurgitation. No evidence of mitral stenosis.  4. The aortic valve is tricuspid. Aortic valve regurgitation is trivial. Aortic valve sclerosis/calcification is present, without any evidence of aortic stenosis. Aortic regurgitation PHT measures 553 msec. Aortic valve area, by VTI measures 1.78 cm. Aortic valve mean gradient measures 6.0 mmHg. Aortic valve Vmax measures 1.62 m/s.  5. The inferior vena cava is dilated in size with <50% respiratory variability, suggesting right atrial pressure of 15 mmHg. FINDINGS  Left Ventricle: Left ventricular ejection fraction, by estimation, is 60 to 65%. The left ventricle has normal function. The left ventricle has no regional wall motion abnormalities. The left ventricular internal cavity size was normal in size. There is  mild left ventricular hypertrophy of the basal-septal segment. Left ventricular diastolic parameters are consistent with Grade I diastolic dysfunction (impaired relaxation). Normal left ventricular filling pressure. Right Ventricle: The right ventricular size is normal. No increase in right ventricular wall thickness. Right ventricular systolic function is normal. There is moderately elevated pulmonary artery systolic pressure. The tricuspid regurgitant  velocity is 3.19 m/s, and with an assumed right atrial pressure of 15 mmHg, the estimated right ventricular systolic pressure is 55.7 mmHg. Left [facility]: Left atrial size was normal in size. Right [facility]: Right atrial size was normal in size. Pericardium: There is no evidence of pericardial effusion. Mitral Valve: The mitral valve is normal in structure. Trivial mitral valve regurgitation. No evidence of mitral valve stenosis. MV peak gradient, 8.2 mmHg. The mean mitral valve gradient is 3.0 mmHg. Tricuspid Valve: The tricuspid valve is  normal in structure. Tricuspid valve regurgitation is trivial. No evidence of tricuspid stenosis. Aortic Valve: The aortic valve is tricuspid. Aortic valve regurgitation is trivial. Aortic regurgitation PHT measures 553 msec. Aortic valve sclerosis/calcification is present, without any evidence of aortic stenosis. Aortic valve mean gradient measures 6.0 mmHg. Aortic valve peak gradient measures 10.5 mmHg. Aortic valve area, by VTI measures 1.78 cm. Pulmonic Valve: The pulmonic valve was normal in structure. Pulmonic valve regurgitation is not visualized. No evidence of pulmonic stenosis. Aorta: The aortic root is normal in size and structure. Venous: The inferior vena cava is dilated in size with less than 50% respiratory variability, suggesting right atrial pressure of 15 mmHg. IAS/Shunts: No atrial level shunt detected by color flow Doppler.  LEFT VENTRICLE PLAX 2D LVIDd:         3.90 cm      Diastology LVIDs:         2.50 cm      LV e' medial:    6.42 cm/s LV PW:         0.90 cm      LV E/e' medial:  14.1 LV IVS:        1.10 cm      LV e' lateral:   9.03 cm/s LVOT diam:     1.80 cm      LV E/e' lateral: 10.0 LV SV:         64 LV SV Index:   42 LVOT Area:     2.54 cm  LV Volumes (MOD) LV vol d, MOD A2C: 89.5 ml LV vol d, MOD A4C: 102.0 ml LV vol s, MOD A2C: 30.7 ml LV vol s, MOD A4C: 30.0 ml LV SV MOD A2C:     58.8 ml LV SV MOD A4C:     102.0 ml LV SV MOD BP:      66.9 ml RIGHT VENTRICLE             IVC RV Basal diam:  3.50 cm     IVC diam: 2.30 cm RV Mid diam:    2.30 cm RV S prime:     14.10 cm/s TAPSE (M-mode): 2.7 cm LEFT ATRIUM             Index        RIGHT ATRIUM           Index LA diam:        3.10 cm 2.04 cm/m   RA Area:     15.80 cm LA Vol (A2C):   61.9 ml 40.79 ml/m  RA Volume:   40.10 ml  26.43 ml/m LA Vol (A4C):   32.6 ml 21.48 ml/m LA Biplane Vol: 49.2 ml 32.42 ml/m  AORTIC VALVE                     PULMONIC VALVE AV Area (Vmax):    2.14 cm      PV Vmax:       0.98  m/s AV Area (Vmean):    1.66 cm      PV Peak grad:  3.9 mmHg AV Area (VTI):     1.78 cm AV Vmax:           162.00 cm/s AV Vmean:          120.000 cm/s AV VTI:            0.357 m AV Peak Grad:      10.5 mmHg AV Mean Grad:      6.0 mmHg LVOT Vmax:         136.00 cm/s LVOT Vmean:        78.400 cm/s LVOT VTI:          0.250 m LVOT/AV VTI ratio: 0.70 AI PHT:            553 msec  AORTA Ao Root diam: 2.90 cm Ao Asc diam:  2.40 cm MITRAL VALVE                TRICUSPID VALVE MV Area (PHT): 3.81 cm     TR Peak grad:   40.7 mmHg MV Area VTI:   2.06 cm     TR Vmax:        319.00 cm/s MV Peak grad:  8.2 mmHg MV Mean grad:  3.0 mmHg     SHUNTS MV Vmax:       1.43 m/s     Systemic VTI:  0.25 m MV Vmean:      77.9 cm/s    Systemic Diam: 1.80 cm MV Decel Time: 199 msec MV E velocity: 90.40 cm/s MV A velocity: 125.00 cm/s MV E/A ratio:  0.72 Gaylyn Keas MD Electronically signed by Gaylyn Keas MD Signature Date/Time: 01/25/2024/12:48:45 PM    Final    US  Abdomen Limited RUQ (LIVER/GB) Result Date: 01/25/2024 CLINICAL DATA:  88 year old female with right abdominal pain, nausea vomiting, suspicious CT appearance of the gallbladder this morning. EXAM: ULTRASOUND ABDOMEN LIMITED RIGHT UPPER QUADRANT COMPARISON:  CT Abdomen and Pelvis 0502 hours. FINDINGS: Gallbladder: Similar gallbladder distension. Small (4 mm) areas of layering sludge or nonshadowing stones (image 11). Gallbladder wall thickness is at the upper limits of normal, 3 mm. No sonographic Murphy sign elicited. No pericholecystic fluid. Common bile duct: Diameter: 2 mm, normal. Liver: No focal lesion identified. Within normal limits in parenchymal echogenicity. Portal vein is patent on color Doppler imaging with normal direction of blood flow towards the liver. Other: Negative visible right kidney. IMPRESSION: 1. Distended gallbladder with small stones and/or foci of sludge. Wall thickness at the upper limits of normal. But absent sonographic Abigail Abler sign argues against acute cholecystitis. 2.  Negative ultrasound appearance of the liver and bile ducts. Electronically Signed   By: Marlise Simpers M.D.   On: 01/25/2024 06:48   CT ABDOMEN PELVIS W CONTRAST Result Date: 01/25/2024 CLINICAL DATA:  88 year old female with right lower abdominal pain. Nausea vomiting. EXAM: CT ABDOMEN AND PELVIS WITH CONTRAST TECHNIQUE: Multidetector CT imaging of the abdomen and pelvis was performed using the standard protocol following bolus administration of intravenous contrast. RADIATION DOSE REDUCTION: This exam was performed according to the departmental dose-optimization program which includes automated exposure control, adjustment of the mA and/or kV according to patient size and/or use of iterative reconstruction technique. CONTRAST:  100mL OMNIPAQUE IOHEXOL 300 MG/ML  SOLN COMPARISON:  Report of abdomen ultrasound 06/26/1994 (no images available). FINDINGS: Lower chest: Curvilinear bilateral lower lobe lung base scarring. Heart size at the upper limits of  normal. No pericardial or pleural effusion. Hepatobiliary: Gallbladder somewhat distended but otherwise within normal limits. Bile ducts appear within normal limits for age. Liver enhancement is within normal limits; subcentimeter but circumscribed low-density area in the inferior right hepatic lobe (coronal image 59) is compatible with benign cyst (no follow-up imaging recommended). Pancreas: Negative. Spleen: Negative.  Splenule, normal variant. Adrenals/Urinary Tract: Normal adrenal glands. Nonobstructed kidneys with symmetric renal enhancement and contrast excretion. Diminutive ureters. Bladder obscured by hip arthroplasty artifact. Stomach/Bowel: Rectum and distal sigmoid colon obscured by hip arthroplasty artifact. Retained stool throughout redundant transverse colon, splenic flexure, descending colon and proximal sigmoid. Less pronounced retained stool in the right colon. Normal appendix is identified (coronal images 52 and 65. No large bowel inflammation  identified. Terminal ileum is decompressed. No dilated small bowel. Small volume fluid in the gastric fundus. Decompressed distal stomach and duodenum. No pneumoperitoneum or free fluid identified. No mesenteric inflammation identified. Vascular/Lymphatic: Advanced Calcified aortic atherosclerosis. Major arterial structures in the abdomen and pelvis remain patent despite bulky calcified atherosclerosis. Abdominal aortic size remains normal. Portal venous system is patent. No lymphadenopathy. Reproductive: Partially obscured by hip arthroplasty artifact, otherwise negative. Other: Hip arthroplasty streak artifact limits pelvic visualization. No free fluid is evident. Musculoskeletal: Bilateral hip arthroplasty with streak artifact. Lumbar spine degeneration including mild grade 1 spondylolisthesis at L4-L5 with vacuum disc. Bulky lumbar facet arthropathy greater on the right. No acute osseous abnormality identified. IMPRESSION: 1. Normal appendix. No acute or inflammatory process identified in the abdomen. But retained stool throughout redundant large bowel, consider constipation. 2. Bilateral hip arthroplasty artifact obscures most visualization of the pelvis. 3. Severe  Aortic Atherosclerosis (ICD10-I70.0). Electronically Signed   By: Marlise Simpers M.D.   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  Dyspnea with exertion Lightheadedness -- the patient reported that she walks at least a mile about 2 times every week.  When she walks at a normal pace she is not short of breath with overexertion does experience dyspnea at times.  Overall intensity and frequency and duration are stable.  No exertional chest  pain. --Denies chest pain, orthopnea, PND, lower extremity edema, palpitations, hematuria, hematochezia. -- An Echo was performed on 01/25/24 and showed LVEF 60-65%, no regional wall motion abnormalities, LVH of basal-septal segment, GIDD, elevated pulmonary artery systolic pressure, RV systolic pressure estimated to be 55.42mmHg, and elevated right atrial pressure of about -- Order orthstatic vital signs -- Anemia workup per primary - darker stools per patient.   Acute cholecystitis Normocytic anemia (hemoglobin 9) Jehovahs witness who does not want blood products Presented to the ER with abdominal pain, nausea and vomiting. Was diagnosed with acute cholecystitis and surgery is tentatively planning a laparoscopic cholesystectomy --Has a RCRI score of 0 this is about a 0.4% perioperative risk of major cardiac events.   -- Is able to do more than 4 metabolic equivalents on the [facility] activity status.  -- High sensitivity troponins negative -- Anemia is new, consider FOBT to evaluate for GI bleed as patient reported having darker stools recently -- Discussed the cardiac risk of surgery as well as the benefits. The patient and the family are in agreement that they  would like to continue   Other conditions managed per primary   Risk Assessment/Risk Scores:    For questions or updates, please contact [facility] Please consult [email or web address] for contact info under    Signed, Melita Springer, PA-C  01/26/2024 8:13 AM   ADDENDUM:   Patient seen and examined with Zane Adams PA-C.  I personally taken a history, examined the patient, reviewed relevant notes,  laboratory data / imaging studies.  I performed a substantive portion of this encounter and formulated the important aspects of the plan.  I agree with the APP's note, impression, and recommendations; however, I have edited the note to reflect changes or salient points.   Patient seen today in the preop as she is being  scheduled for laparoscopic cholecystectomy. Daughter Baker Bon is present at bedside. Patient has not been under the care of cardiology to the best of her memory. She presents to the hospital due to abdominal pain, nausea, vomiting.  Diagnosed with acute cholecystitis with intentions to undergo laparoscopic cholecystectomy.  Cardiology consulted during his hospitalization given her history of dyspnea and preoperative risk stratification.  Patient denies history of myocardial infarction, coronary stents, bypass surgery, DVT/PE, diabetes. Patient's serum creatinine levels are <2 mg/dL. EKG illustrates sinus rhythm with ST-T changes which could be secondary to LVH but completely cannot rule out ischemia. Clinically she is walking at least 1 mile several times a week and has no symptoms at baseline. No exertional chest pain. Echocardiogram also illustrates preserved LVEF and no regional wall motion abnormalities.  She does have grade 1 diastolic dysfunction and mild pulmonary hypertension which has been noted on prior echocardiograms as well as as well.  PHYSICAL EXAM: Today's Vitals   01/26/24 0652 01/26/24 0907 01/26/24 0945 01/26/24 1002  BP: (!) 150/57   (!) 182/58  Pulse: 70   72  Resp:    16  Temp:    98.4 F (36.9 C)  TempSrc:    Oral  SpO2:    99%  Weight:    52.3 kg  Height:   5\' 4"  (1.626 m)   PainSc:  3  0-No pain    Body mass index is 19.81 kg/m.   Net IO Since Admission: 2,283.36 mL [01/26/24 1015]  Filed Weights   01/25/24 0320 01/26/24 1002  Weight: 49.9 kg 52.3 kg    Physical Exam  Constitutional: She appears healthy. No distress.  hemodynamically stable  Neck: No JVD present.  Cardiovascular: Normal rate, regular rhythm, S1 normal and S2 normal. Exam reveals no gallop, no S3 and no S4.  No murmur heard. Pulses:      Radial pulses are 2+ on the right side and 2+ on the left side.       Dorsalis pedis pulses are 1+ on the right side and 1+ on the left side.        Posterior tibial pulses are 1+ on the right side and 1+ on the left side.  Pulmonary/Chest: Effort normal and breath sounds normal. No stridor. She has no wheezes. She has no rales.  Abdominal: Soft. Bowel sounds are normal. She exhibits no distension. There is abdominal tenderness.  Musculoskeletal:        General: No edema.     Cervical back: Neck supple.  Skin: Skin is warm.    EKG: (personally reviewed by me) 01/25/2023: SR, 69bpm, subtle ST-T changes in inferior leads cannot rule out ischemia, PVC.   01/25/2023: SR 71bpm, LVH per voltage, ST-T changes likely due to  repolarization but ischemia cannot be ruled out. PVCs no longer present.   Telemetry: (personally reviewed by me) SR no arrhythmia   Impression:  Preoperative risk stratification Dyspnea on exertion Acute cholecystitis Grade 1 diastolic dysfunction Hypertension Anemia Jehovah's Witness  Recommendations:  Preoperative risk stratification: Scheduled for laparoscopic cholecystectomy. According to the Revised Cardiac Risk Index (RCRI), her score is 0 which indicates 0.4% perioperative risk of major cardiac events.   Echocardiogram notes preserved LVEF without regional wall motion abnormalities or severe valvular heart disease. High sensitive troponins are negative. EKG shows sinus rhythm with LVH and ST-T changes. Patient walks at least 1 mile several times a week and does not have any anginal chest pain.  She has no shortness of breath when walking at a steady pace but does get dyspnea with overexertion.  This is chronic and stable. Patient is considered to be acceptable risk for upcoming noncardiac surgery.  This was discussed with both patient and her daughter Archie Bearded at bedside.  They find the risk stratification acceptable.  She is optimized from a cardiovascular standpoint.  Final decision to proceed with surgery dependent on patient's and family wishes/surgical team. Cardiology will follow with you.  Dyspnea on  exertion: Chronic and stable. Multifactorial-known history of grade 1 diastolic dysfunction, mild pulmonary hypertension based on echocardiography, anemia, hypertension, etc. Echocardiogram results reviewed. Clinically not in overt heart failure. Recommend outpatient workup with cardiology and primary team  Acute cholecystitis: Management per primary and surgical team.  Grade 1 diastolic dysfunction: Well compensated as per physical examination.  Uptitrate GDMT for blood pressure and volume management as needed.  Hypertension: Recommend better blood pressure management.  Will defer to primary team  Anemia Jehovah's Witness Recommend anemia workup prior to discharge, will defer to primary team.   Further recommendations to follow as the case evolves.   This note was created using a voice recognition software as a result there may be grammatical errors inadvertently enclosed that do not reflect the nature of this encounter. Every attempt is made to correct such errors.   Everett Ricciardelli [place], [place], [facility]  [facility]  01/26/2024 10:15 AM

## 2024-01-26 NOTE — Interval H&P Note (Signed)
 History and Physical Interval Note:  01/26/2024 10:32 AM  Crystal Richard  has presented today for surgery, with the diagnosis of ACUTE CHOLECYSTITIS.  The various methods of treatment have been discussed with the patient and family. After consideration of risks, benefits and other options for treatment, the patient has consented to  Procedure(s) with comments: LAPAROSCOPIC CHOLECYSTECTOMY WITH INTRAOPERATIVE CHOLANGIOGRAM (N/A) - WITH ICG DYE as a surgical intervention.  The patient's history has been reviewed, patient examined, no change in status, stable for surgery.  I have reviewed the patient's chart and labs.  Questions were answered to the patient's satisfaction.   The procedure has been discussed with the patient. Operative and non operative treatments have been discussed. Risks of surgery include bleeding, infection,  Common bile duct injury,  Injury to the stomach,liver, colon,small intestine, abdominal wall,  Diaphragm,  Major blood vessels,  And the need for an open procedure.  Other risks include worsening of medical problems, death,  DVT and pulmonary embolism, and cardiovascular events.   Medical options have also been discussed. The patient has been informed of long term expectations of surgery and non surgical options,  The patient agrees to proceed.     Shayan Bramhall A Eliyohu Class

## 2024-01-26 NOTE — Discharge Instructions (Signed)

## 2024-01-26 NOTE — Anesthesia Procedure Notes (Signed)
 Procedure Name: Intubation Date/Time: 01/26/2024 11:26 AM  Performed by: Hunter Maha, CRNAPre-anesthesia Checklist: Patient identified, Emergency Drugs available, Suction available and Patient being monitored Patient Re-evaluated:Patient Re-evaluated prior to induction Oxygen Delivery Method: Circle system utilized Preoxygenation: Pre-oxygenation with 100% oxygen Induction Type: IV induction Ventilation: Mask ventilation without difficulty Laryngoscope Size: Mac and 3 Grade View: Grade I Tube type: Oral Tube size: 7.0 mm Number of attempts: 1 Airway Equipment and Method: Stylet and Oral airway Placement Confirmation: ETT inserted through vocal cords under direct vision, positive ETCO2 and breath sounds checked- equal and bilateral Secured at: 21 cm Tube secured with: Tape Dental Injury: Teeth and Oropharynx as per pre-operative assessment

## 2024-01-26 NOTE — H&P (View-Only) (Signed)
 Subjective/Chief Complaint: Patient with fever overnight.  Cardiology evaluating.  Still with abdominal pain.   Objective: Vital signs in last 24 hours: Temp:  [99 F (37.2 C)-101.3 F (38.5 C)] 99.2 F (37.3 C) (05/16 0555) Pulse Rate:  [63-83] 70 (05/16 0652) Resp:  [14-21] 15 (05/16 0555) BP: (128-166)/(45-73) 150/57 (05/16 0652) SpO2:  [93 %-98 %] 94 % (05/16 0555) Last BM Date : 01/24/24  Intake/Output from previous day: 05/15 0701 - 05/16 0700 In: 2015.1 [P.O.:240; I.V.:825.1; IV Piggyback:950] Out: -  Intake/Output this shift: Total I/O In: 268.2 [I.V.:156.9; IV Piggyback:111.3] Out: -   Abdomen: Tender right upper quadrant  Lab Results:  Recent Labs    01/25/24 0328 01/26/24 0335  WBC 8.5 8.4  HGB 10.6* 9.0*  HCT 32.9* 28.7*  PLT 222 133*   BMET Recent Labs    01/25/24 0328 01/26/24 0335  NA 140 134*  K 3.3* 4.1  CL 103 107  CO2 24 20*  GLUCOSE 209* 92  BUN 15 17  CREATININE 0.89 1.03*  CALCIUM 9.6 8.3*   PT/INR No results for input(s): "LABPROT", "INR" in the last 72 hours. ABG No results for input(s): "PHART", "HCO3" in the last 72 hours.  Invalid input(s): "PCO2", "PO2"  Studies/Results: ECHOCARDIOGRAM COMPLETE Result Date: 01/25/2024    ECHOCARDIOGRAM REPORT   Patient Name:   Crystal Richard Date of Exam: 01/25/2024 Medical Rec #:  [id number]      Height:       64.0 in Accession #:    [id number]     Weight:       110.0 lb Date of Birth:  1936/05/17       BSA:          1.517 m Patient Age:    88 years       BP:           150/50 mmHg Patient Gender: F              HR:           81 bpm. Exam Location:  Inpatient Procedure: 2D Echo, Cardiac Doppler and Color Doppler (Both Spectral and Color            Flow Doppler were utilized during procedure). Indications:    Abnormal ECG, preoperative evaluation  History:        Patient has prior history of Echocardiogram examinations, most                 recent 04/23/2017. Risk Factors:Hypertension,  Dyslipidemia and                 Diabetes. CKD stage 3.  Sonographer:    Juanita Shaw Referring Phys: [id number] DAVID MANUEL ORTIZ IMPRESSIONS  1. Left ventricular ejection fraction, by estimation, is 60 to 65%. The left ventricle has normal function. The left ventricle has no regional wall motion abnormalities. There is mild left ventricular hypertrophy of the basal-septal segment. Left ventricular diastolic parameters are consistent with Grade I diastolic dysfunction (impaired relaxation).  2. Right ventricular systolic function is normal. The right ventricular size is normal. There is moderately elevated pulmonary artery systolic pressure. The estimated right ventricular systolic pressure is 55.7 mmHg.  3. The mitral valve is normal in structure. Trivial mitral valve regurgitation. No evidence of mitral stenosis.  4. The aortic valve is tricuspid. Aortic valve regurgitation is trivial. Aortic valve sclerosis/calcification is present, without any evidence of aortic stenosis. Aortic regurgitation PHT measures 553 msec. Aortic valve area, by  VTI measures 1.78 cm. Aortic valve mean gradient measures 6.0 mmHg. Aortic valve Vmax measures 1.62 m/s.  5. The inferior vena cava is dilated in size with <50% respiratory variability, suggesting right atrial pressure of 15 mmHg. FINDINGS  Left Ventricle: Left ventricular ejection fraction, by estimation, is 60 to 65%. The left ventricle has normal function. The left ventricle has no regional wall motion abnormalities. The left ventricular internal cavity size was normal in size. There is  mild left ventricular hypertrophy of the basal-septal segment. Left ventricular diastolic parameters are consistent with Grade I diastolic dysfunction (impaired relaxation). Normal left ventricular filling pressure. Right Ventricle: The right ventricular size is normal. No increase in right ventricular wall thickness. Right ventricular systolic function is normal. There is moderately elevated  pulmonary artery systolic pressure. The tricuspid regurgitant velocity is 3.19 m/s, and with an assumed right atrial pressure of 15 mmHg, the estimated right ventricular systolic pressure is 55.7 mmHg. Left Atrium: Left atrial size was normal in size. Right Atrium: Right atrial size was normal in size. Pericardium: There is no evidence of pericardial effusion. Mitral Valve: The mitral valve is normal in structure. Trivial mitral valve regurgitation. No evidence of mitral valve stenosis. MV peak gradient, 8.2 mmHg. The mean mitral valve gradient is 3.0 mmHg. Tricuspid Valve: The tricuspid valve is normal in structure. Tricuspid valve regurgitation is trivial. No evidence of tricuspid stenosis. Aortic Valve: The aortic valve is tricuspid. Aortic valve regurgitation is trivial. Aortic regurgitation PHT measures 553 msec. Aortic valve sclerosis/calcification is present, without any evidence of aortic stenosis. Aortic valve mean gradient measures 6.0 mmHg. Aortic valve peak gradient measures 10.5 mmHg. Aortic valve area, by VTI measures 1.78 cm. Pulmonic Valve: The pulmonic valve was normal in structure. Pulmonic valve regurgitation is not visualized. No evidence of pulmonic stenosis. Aorta: The aortic root is normal in size and structure. Venous: The inferior vena cava is dilated in size with less than 50% respiratory variability, suggesting right atrial pressure of 15 mmHg. IAS/Shunts: No atrial level shunt detected by color flow Doppler.  LEFT VENTRICLE PLAX 2D LVIDd:         3.90 cm      Diastology LVIDs:         2.50 cm      LV e' medial:    6.42 cm/s LV PW:         0.90 cm      LV E/e' medial:  14.1 LV IVS:        1.10 cm      LV e' lateral:   9.03 cm/s LVOT diam:     1.80 cm      LV E/e' lateral: 10.0 LV SV:         64 LV SV Index:   42 LVOT Area:     2.54 cm  LV Volumes (MOD) LV vol d, MOD A2C: 89.5 ml LV vol d, MOD A4C: 102.0 ml LV vol s, MOD A2C: 30.7 ml LV vol s, MOD A4C: 30.0 ml LV SV MOD A2C:     58.8 ml  LV SV MOD A4C:     102.0 ml LV SV MOD BP:      66.9 ml RIGHT VENTRICLE             IVC RV Basal diam:  3.50 cm     IVC diam: 2.30 cm RV Mid diam:    2.30 cm RV S prime:     14.10 cm/s TAPSE (M-mode): 2.7 cm LEFT  ATRIUM             Index        RIGHT ATRIUM           Index LA diam:        3.10 cm 2.04 cm/m   RA Area:     15.80 cm LA Vol (A2C):   61.9 ml 40.79 ml/m  RA Volume:   40.10 ml  26.43 ml/m LA Vol (A4C):   32.6 ml 21.48 ml/m LA Biplane Vol: 49.2 ml 32.42 ml/m  AORTIC VALVE                     PULMONIC VALVE AV Area (Vmax):    2.14 cm      PV Vmax:       0.98 m/s AV Area (Vmean):   1.66 cm      PV Peak grad:  3.9 mmHg AV Area (VTI):     1.78 cm AV Vmax:           162.00 cm/s AV Vmean:          120.000 cm/s AV VTI:            0.357 m AV Peak Grad:      10.5 mmHg AV Mean Grad:      6.0 mmHg LVOT Vmax:         136.00 cm/s LVOT Vmean:        78.400 cm/s LVOT VTI:          0.250 m LVOT/AV VTI ratio: 0.70 AI PHT:            553 msec  AORTA Ao Root diam: 2.90 cm Ao Asc diam:  2.40 cm MITRAL VALVE                TRICUSPID VALVE MV Area (PHT): 3.81 cm     TR Peak grad:   40.7 mmHg MV Area VTI:   2.06 cm     TR Vmax:        319.00 cm/s MV Peak grad:  8.2 mmHg MV Mean grad:  3.0 mmHg     SHUNTS MV Vmax:       1.43 m/s     Systemic VTI:  0.25 m MV Vmean:      77.9 cm/s    Systemic Diam: 1.80 cm MV Decel Time: 199 msec MV E velocity: 90.40 cm/s MV A velocity: 125.00 cm/s MV E/A ratio:  0.72 Gaylyn Keas MD Electronically signed by Gaylyn Keas MD Signature Date/Time: 01/25/2024/12:48:45 PM    Final    US  Abdomen Limited RUQ (LIVER/GB) Result Date: 01/25/2024 CLINICAL DATA:  88 year old female with right abdominal pain, nausea vomiting, suspicious CT appearance of the gallbladder this morning. EXAM: ULTRASOUND ABDOMEN LIMITED RIGHT UPPER QUADRANT COMPARISON:  CT Abdomen and Pelvis 0502 hours. FINDINGS: Gallbladder: Similar gallbladder distension. Small (4 mm) areas of layering sludge or nonshadowing stones  (image 11). Gallbladder wall thickness is at the upper limits of normal, 3 mm. No sonographic Murphy sign elicited. No pericholecystic fluid. Common bile duct: Diameter: 2 mm, normal. Liver: No focal lesion identified. Within normal limits in parenchymal echogenicity. Portal vein is patent on color Doppler imaging with normal direction of blood flow towards the liver. Other: Negative visible right kidney. IMPRESSION: 1. Distended gallbladder with small stones and/or foci of sludge. Wall thickness at the upper limits of normal. But absent sonographic Abigail Abler sign argues against acute cholecystitis. 2. Negative ultrasound appearance of the  liver and bile ducts. Electronically Signed   By: Marlise Simpers M.D.   On: 01/25/2024 06:48   CT ABDOMEN PELVIS W CONTRAST Result Date: 01/25/2024 CLINICAL DATA:  88 year old female with right lower abdominal pain. Nausea vomiting. EXAM: CT ABDOMEN AND PELVIS WITH CONTRAST TECHNIQUE: Multidetector CT imaging of the abdomen and pelvis was performed using the standard protocol following bolus administration of intravenous contrast. RADIATION DOSE REDUCTION: This exam was performed according to the departmental dose-optimization program which includes automated exposure control, adjustment of the mA and/or kV according to patient size and/or use of iterative reconstruction technique. CONTRAST:  100mL OMNIPAQUE IOHEXOL 300 MG/ML  SOLN COMPARISON:  Report of abdomen ultrasound 06/26/1994 (no images available). FINDINGS: Lower chest: Curvilinear bilateral lower lobe lung base scarring. Heart size at the upper limits of normal. No pericardial or pleural effusion. Hepatobiliary: Gallbladder somewhat distended but otherwise within normal limits. Bile ducts appear within normal limits for age. Liver enhancement is within normal limits; subcentimeter but circumscribed low-density area in the inferior right hepatic lobe (coronal image 59) is compatible with benign cyst (no follow-up imaging  recommended). Pancreas: Negative. Spleen: Negative.  Splenule, normal variant. Adrenals/Urinary Tract: Normal adrenal glands. Nonobstructed kidneys with symmetric renal enhancement and contrast excretion. Diminutive ureters. Bladder obscured by hip arthroplasty artifact. Stomach/Bowel: Rectum and distal sigmoid colon obscured by hip arthroplasty artifact. Retained stool throughout redundant transverse colon, splenic flexure, descending colon and proximal sigmoid. Less pronounced retained stool in the right colon. Normal appendix is identified (coronal images 52 and 65. No large bowel inflammation identified. Terminal ileum is decompressed. No dilated small bowel. Small volume fluid in the gastric fundus. Decompressed distal stomach and duodenum. No pneumoperitoneum or free fluid identified. No mesenteric inflammation identified. Vascular/Lymphatic: Advanced Calcified aortic atherosclerosis. Major arterial structures in the abdomen and pelvis remain patent despite bulky calcified atherosclerosis. Abdominal aortic size remains normal. Portal venous system is patent. No lymphadenopathy. Reproductive: Partially obscured by hip arthroplasty artifact, otherwise negative. Other: Hip arthroplasty streak artifact limits pelvic visualization. No free fluid is evident. Musculoskeletal: Bilateral hip arthroplasty with streak artifact. Lumbar spine degeneration including mild grade 1 spondylolisthesis at L4-L5 with vacuum disc. Bulky lumbar facet arthropathy greater on the right. No acute osseous abnormality identified. IMPRESSION: 1. Normal appendix. No acute or inflammatory process identified in the abdomen. But retained stool throughout redundant large bowel, consider constipation. 2. Bilateral hip arthroplasty artifact obscures most visualization of the pelvis. 3. Severe  Aortic Atherosclerosis (ICD10-I70.0). Electronically Signed   By: Marlise Simpers M.D.   On: 01/25/2024 05:20    Anti-infectives: Anti-infectives (From  admission, onward)    Start     Dose/Rate Route Frequency Ordered Stop   01/26/24 0600  cefTRIAXone (ROCEPHIN) 2 g in sodium chloride  0.9 % 100 mL IVPB        2 g 200 mL/hr over 30 Minutes Intravenous Every 24 hours 01/25/24 0958     01/25/24 1015  metroNIDAZOLE (FLAGYL) IVPB 500 mg        500 mg 100 mL/hr over 60 Minutes Intravenous 2 times daily 01/25/24 0958     01/25/24 0715  cefTRIAXone (ROCEPHIN) 2 g in sodium chloride  0.9 % 100 mL IVPB        2 g 200 mL/hr over 30 Minutes Intravenous  Once 01/25/24 0714 01/25/24 0805       Assessment/Plan: Acute cholecystitis-bilirubin 2.8 today and possibility of common duct stone noted.  Recommend IOC during procedure.  Await cardiology clearance.  Plan will be for laparoscopic  cholecystectomy today if she is cleared by cardiology.\    The procedure has been discussed with the patient. Operative and non operative treatments have been discussed. Risks of surgery include bleeding, infection,  Common bile duct injury,  Injury to the stomach,liver, colon,small intestine, abdominal wall,  Diaphragm,  Major blood vessels,  And the need for an open procedure.  Other risks include worsening of medical problems, death,  DVT and pulmonary embolism, and cardiovascular events.   Medical options have also been discussed. The patient has been informed of long term expectations of surgery and non surgical options,  The patient agrees to proceed.      LOS: 1 day    Rodrigo Clara 01/26/2024 High complexity

## 2024-01-27 DIAGNOSIS — K810 Acute cholecystitis: Secondary | ICD-10-CM

## 2024-01-27 LAB — IRON AND TIBC
Iron: 15 ug/dL — ABNORMAL LOW (ref 28–170)
Saturation Ratios: 5 % — ABNORMAL LOW (ref 10.4–31.8)
TIBC: 301 ug/dL (ref 250–450)
UIBC: 286 ug/dL

## 2024-01-27 LAB — COMPREHENSIVE METABOLIC PANEL WITH GFR
ALT: 112 U/L — ABNORMAL HIGH (ref 0–44)
AST: 67 U/L — ABNORMAL HIGH (ref 15–41)
Albumin: 2.9 g/dL — ABNORMAL LOW (ref 3.5–5.0)
Alkaline Phosphatase: 96 U/L (ref 38–126)
Anion gap: 8 (ref 5–15)
BUN: 17 mg/dL (ref 8–23)
CO2: 23 mmol/L (ref 22–32)
Calcium: 8.6 mg/dL — ABNORMAL LOW (ref 8.9–10.3)
Chloride: 108 mmol/L (ref 98–111)
Creatinine, Ser: 1.04 mg/dL — ABNORMAL HIGH (ref 0.44–1.00)
GFR, Estimated: 53 mL/min — ABNORMAL LOW (ref >60)
Glucose, Bld: 93 mg/dL (ref 70–99)
Potassium: 4.1 mmol/L (ref 3.5–5.1)
Sodium: 139 mmol/L (ref 135–145)
Total Bilirubin: 0.9 mg/dL (ref 0.0–1.2)
Total Protein: 6.3 g/dL — ABNORMAL LOW (ref 6.5–8.1)

## 2024-01-27 LAB — CBC
HCT: 30.4 % — ABNORMAL LOW (ref 36.0–46.0)
Hemoglobin: 9.4 g/dL — ABNORMAL LOW (ref 12.0–15.0)
MCH: 28.6 pg (ref 26.0–34.0)
MCHC: 30.9 g/dL (ref 30.0–36.0)
MCV: 92.4 fL (ref 80.0–100.0)
Platelets: 152 10*3/uL (ref 150–400)
RBC: 3.29 MIL/uL — ABNORMAL LOW (ref 3.87–5.11)
RDW: 15.4 % (ref 11.5–15.5)
WBC: 9 10*3/uL (ref 4.0–10.5)
nRBC: 0 % (ref 0.0–0.2)

## 2024-01-27 LAB — GLUCOSE, CAPILLARY
Glucose-Capillary: 85 mg/dL (ref 70–99)
Glucose-Capillary: 142 mg/dL — ABNORMAL HIGH (ref 70–99)

## 2024-01-27 LAB — VITAMIN B12: Vitamin B-12: 571 pg/mL (ref 180–914)

## 2024-01-27 LAB — HEMOGLOBIN A1C
Hgb A1c MFr Bld: 5.8 % — ABNORMAL HIGH (ref 4.8–5.6)
Mean Plasma Glucose: 119.76 mg/dL

## 2024-01-27 LAB — FOLATE: Folate: 18.8 ng/mL (ref >5.9)

## 2024-01-27 LAB — FERRITIN: Ferritin: 63 ng/mL (ref 11–307)

## 2024-01-27 MED ORDER — OXYCODONE HCL 5 MG PO TABS: 2.5000 mg | ORAL_TABLET | ORAL | 0 refills | Status: AC | PRN

## 2024-01-27 MED ORDER — AMLODIPINE BESYLATE 5 MG PO TABS: 5.0000 mg | ORAL_TABLET | Freq: Every day | ORAL | 1 refills | Status: AC

## 2024-01-27 NOTE — Discharge Summary (Signed)
 Physician Discharge Summary  Breaunna Gottlieb NWG:[id number] DOB: 10/14/1935 DOA: 01/25/2024  PCP: Lieutenant Reese., MD  Admit date: 01/25/2024 Discharge date: 01/27/2024  Admitted From: Home Disposition: Home  Recommendations for Outpatient Follow-up:  Follow up with PCP.  Recommend continued workup outpatient for her chronic anemia Follow-up with general surgery as scheduled 6/10  Discharge Condition: Stable CODE STATUS: Full code Diet recommendation: Regular diet  Brief/Interim Summary: Zellie Pasternak is a 88 y.o. female with medical history significant of hyperlipidemia, grade 1 diastolic dysfunction, hypertension, urinary incontinence, stage 3a CKD, postural dizziness and presyncope, arthralgias, osteoarthritis, osteoporosis, adjustment disorder with mixed anxiety and depressed mood, vitamin B12 deficiency who presented to the emergency department with complaints of right upper quadrant abdominal and right flank pain associated with fever.  Workup concerning for acute cholecystitis.  General surgery was consulted.  Patient underwent lap cholecystectomy by Dr. Afton Horse 5/16.  Discharge Diagnoses:  Principal Problem:   Acute cholecystitis Active Problems:   Gallbladder dilatation   Stage 3a chronic kidney disease (HCC)   Vitamin B12 deficiency   Hypokalemia   Benign hypertension   Type 2 diabetes mellitus with hyperglycemia (HCC)   Grade I diastolic dysfunction   Dyspnea on exertion   Fever   Preop cardiovascular exam   Anemia     Acute cholecystitis - Status post lap chole 5/16   Grade 1 diastolic dysfunction - Stable   CKD stage IIIa - Stable.  Baseline creatinine 1.1   Hypertension - Losartan , started Norvasc    Diabetes mellitus type 2 - Sliding scale insulin    Chronic normocytic anemia - Baseline hemoglobin 10.8.  Previous workup in 2024 revealed normal iron panel  - Iron studies, B12, folate - Jehovah's Witness and blood product refusal - Follow-up as  outpatient    Discharge Instructions  Discharge Instructions     Call MD for:  difficulty breathing, headache or visual disturbances   Complete by: As directed    Call MD for:  extreme fatigue   Complete by: As directed    Call MD for:  hives   Complete by: As directed    Call MD for:  persistant dizziness or light-headedness   Complete by: As directed    Call MD for:  persistant nausea and vomiting   Complete by: As directed    Call MD for:  redness, tenderness, or signs of infection (pain, swelling, redness, odor or green/yellow discharge around incision site)   Complete by: As directed    Call MD for:  severe uncontrolled pain   Complete by: As directed    Call MD for:  temperature >100.4   Complete by: As directed    Diet general   Complete by: As directed    Discharge instructions   Complete by: As directed    You were cared for by a hospitalist during your hospital stay. If you have any questions about your discharge medications or the care you received while you were in the hospital after you are discharged, you can call the unit and ask to speak with the hospitalist on call if the hospitalist that took care of you is not available. Once you are discharged, your primary care physician will handle any further medical issues. Please note that NO REFILLS for any discharge medications will be authorized once you are discharged, as it is imperative that you return to your primary care physician (or establish a relationship with a primary care physician if you do not have one) for your aftercare needs  so that they can reassess your need for medications and monitor your lab values.   Increase activity slowly   Complete by: As directed       Allergies as of 01/27/2024   No Known Allergies      Medication List     TAKE these medications    acetaminophen  500 MG tablet Commonly known as: TYLENOL  Take 500 mg by mouth every 6 (six) hours as needed for headache, moderate pain  (pain score 4-6) or mild pain (pain score 1-3) (pain).   amLODipine  5 MG tablet Commonly known as: NORVASC  Take 1 tablet (5 mg total) by mouth daily. Start taking on: Jan 28, 2024   losartan  25 MG tablet Commonly known as: COZAAR  Take 25 mg by mouth in the morning and at bedtime.   oxyCODONE  5 MG immediate release tablet Commonly known as: Oxy IR/ROXICODONE  Take 0.5-1 tablets (2.5-5 mg total) by mouth every 4 (four) hours as needed for moderate pain (pain score 4-6).   pravastatin  40 MG tablet Commonly known as: PRAVACHOL  Take 20 mg by mouth at bedtime.   VITAMIN B-12 PO Take 1 capsule by mouth daily.        Follow-up Information     Maczis, Puja Gosai, PA-C. Go on 02/20/2024.   Specialty: General Surgery Why: 10 AM, please arrive 30 min prior to appointment time to check in. Contact information: [address] [place] [address] [facility] [place] [facility] [place] [address] [phone]         Lieutenant Reese., MD Follow up.   Specialty: Internal Medicine Contact information: [address] [place] [address] [phone]                No Known Allergies  Consultations: General surgery   Procedures/Studies: DG Cholangiogram Operative Result Date: 01/26/2024 CLINICAL DATA:  Status post cholecystectomy. EXAM: INTRAOPERATIVE CHOLANGIOGRAM TECHNIQUE: Cholangiographic images from the C-arm fluoroscopic device were submitted for interpretation post-operatively. Please see the procedural report for the amount of contrast and the fluoroscopy time utilized. FLUOROSCOPY: Radiation Exposure Index (as provided by the fluoroscopic device): 2.5394 MGy Kerma COMPARISON:  Jan 25, 2024. FINDINGS: Fluoroscopic guidance was provided while contrast was injected into cystic duct remnant status post cholecystectomy. No definite filling defect is seen to suggest residual stone. Antegrade flow of contrast into duodenum is noted. Some extravasation of contrast is seen  from the cystic duct remnant. IMPRESSION: Fluoroscopic guidance provided during contrast injection status post cholecystectomy. Electronically Signed   By: Rosalene Colon M.D.   On: 01/26/2024 14:44   ECHOCARDIOGRAM COMPLETE Result Date: 01/25/2024    ECHOCARDIOGRAM REPORT   Patient Name:   KAYDEN AMEND Date of Exam: 01/25/2024 Medical Rec #:  [id number]      Height:       64.0 in Accession #:    [id number]     Weight:       110.0 lb Date of Birth:  [birth year]       BSA:          1.517 m Patient Age:    87 years       BP:           150/50 mmHg Patient Gender: F              HR:           81 bpm. Exam Location:  Inpatient Procedure: 2D Echo, Cardiac Doppler and Color Doppler (Both Spectral and Color  Flow Doppler were utilized during procedure). Indications:    Abnormal ECG, preoperative evaluation  History:        Patient has prior history of Echocardiogram examinations, most                 recent 04/23/2017. Risk Factors:Hypertension, Dyslipidemia and                 Diabetes. CKD stage 3.  Sonographer:    Juanita Shaw Referring Phys: [id number] DAVID MANUEL ORTIZ IMPRESSIONS  1. Left ventricular ejection fraction, by estimation, is 60 to 65%. The left ventricle has normal function. The left ventricle has no regional wall motion abnormalities. There is mild left ventricular hypertrophy of the basal-septal segment. Left ventricular diastolic parameters are consistent with Grade I diastolic dysfunction (impaired relaxation).  2. Right ventricular systolic function is normal. The right ventricular size is normal. There is moderately elevated pulmonary artery systolic pressure. The estimated right ventricular systolic pressure is 55.7 mmHg.  3. The mitral valve is normal in structure. Trivial mitral valve regurgitation. No evidence of mitral stenosis.  4. The aortic valve is tricuspid. Aortic valve regurgitation is trivial. Aortic valve sclerosis/calcification is present, without any evidence of aortic  stenosis. Aortic regurgitation PHT measures 553 msec. Aortic valve area, by VTI measures 1.78 cm. Aortic valve mean gradient measures 6.0 mmHg. Aortic valve Vmax measures 1.62 m/s.  5. The inferior vena cava is dilated in size with <50% respiratory variability, suggesting right atrial pressure of 15 mmHg. FINDINGS  Left Ventricle: Left ventricular ejection fraction, by estimation, is 60 to 65%. The left ventricle has normal function. The left ventricle has no regional wall motion abnormalities. The left ventricular internal cavity size was normal in size. There is  mild left ventricular hypertrophy of the basal-septal segment. Left ventricular diastolic parameters are consistent with Grade I diastolic dysfunction (impaired relaxation). Normal left ventricular filling pressure. Right Ventricle: The right ventricular size is normal. No increase in right ventricular wall thickness. Right ventricular systolic function is normal. There is moderately elevated pulmonary artery systolic pressure. The tricuspid regurgitant velocity is 3.19 m/s, and with an assumed right atrial pressure of 15 mmHg, the estimated right ventricular systolic pressure is 55.7 mmHg. Left Atrium: Left atrial size was normal in size. Right Atrium: Right atrial size was normal in size. Pericardium: There is no evidence of pericardial effusion. Mitral Valve: The mitral valve is normal in structure. Trivial mitral valve regurgitation. No evidence of mitral valve stenosis. MV peak gradient, 8.2 mmHg. The mean mitral valve gradient is 3.0 mmHg. Tricuspid Valve: The tricuspid valve is normal in structure. Tricuspid valve regurgitation is trivial. No evidence of tricuspid stenosis. Aortic Valve: The aortic valve is tricuspid. Aortic valve regurgitation is trivial. Aortic regurgitation PHT measures 553 msec. Aortic valve sclerosis/calcification is present, without any evidence of aortic stenosis. Aortic valve mean gradient measures 6.0 mmHg. Aortic valve  peak gradient measures 10.5 mmHg. Aortic valve area, by VTI measures 1.78 cm. Pulmonic Valve: The pulmonic valve was normal in structure. Pulmonic valve regurgitation is not visualized. No evidence of pulmonic stenosis. Aorta: The aortic root is normal in size and structure. Venous: The inferior vena cava is dilated in size with less than 50% respiratory variability, suggesting right atrial pressure of 15 mmHg. IAS/Shunts: No atrial level shunt detected by color flow Doppler.  LEFT VENTRICLE PLAX 2D LVIDd:         3.90 cm      Diastology LVIDs:  2.50 cm      LV e' medial:    6.42 cm/s LV PW:         0.90 cm      LV E/e' medial:  14.1 LV IVS:        1.10 cm      LV e' lateral:   9.03 cm/s LVOT diam:     1.80 cm      LV E/e' lateral: 10.0 LV SV:         64 LV SV Index:   42 LVOT Area:     2.54 cm  LV Volumes (MOD) LV vol d, MOD A2C: 89.5 ml LV vol d, MOD A4C: 102.0 ml LV vol s, MOD A2C: 30.7 ml LV vol s, MOD A4C: 30.0 ml LV SV MOD A2C:     58.8 ml LV SV MOD A4C:     102.0 ml LV SV MOD BP:      66.9 ml RIGHT VENTRICLE             IVC RV Basal diam:  3.50 cm     IVC diam: 2.30 cm RV Mid diam:    2.30 cm RV S prime:     14.10 cm/s TAPSE (M-mode): 2.7 cm LEFT ATRIUM             Index        RIGHT ATRIUM           Index LA diam:        3.10 cm 2.04 cm/m   RA Area:     15.80 cm LA Vol (A2C):   61.9 ml 40.79 ml/m  RA Volume:   40.10 ml  26.43 ml/m LA Vol (A4C):   32.6 ml 21.48 ml/m LA Biplane Vol: 49.2 ml 32.42 ml/m  AORTIC VALVE                     PULMONIC VALVE AV Area (Vmax):    2.14 cm      PV Vmax:       0.98 m/s AV Area (Vmean):   1.66 cm      PV Peak grad:  3.9 mmHg AV Area (VTI):     1.78 cm AV Vmax:           162.00 cm/s AV Vmean:          120.000 cm/s AV VTI:            0.357 m AV Peak Grad:      10.5 mmHg AV Mean Grad:      6.0 mmHg LVOT Vmax:         136.00 cm/s LVOT Vmean:        78.400 cm/s LVOT VTI:          0.250 m LVOT/AV VTI ratio: 0.70 AI PHT:            553 msec  AORTA Ao Root diam:  2.90 cm Ao Asc diam:  2.40 cm MITRAL VALVE                TRICUSPID VALVE MV Area (PHT): 3.81 cm     TR Peak grad:   40.7 mmHg MV Area VTI:   2.06 cm     TR Vmax:        319.00 cm/s MV Peak grad:  8.2 mmHg MV Mean grad:  3.0 mmHg     SHUNTS MV Vmax:       1.43 m/s  Systemic VTI:  0.25 m MV Vmean:      77.9 cm/s    Systemic Diam: 1.80 cm MV Decel Time: 199 msec MV E velocity: 90.40 cm/s MV A velocity: 125.00 cm/s MV E/A ratio:  0.72 Gaylyn Keas MD Electronically signed by Gaylyn Keas MD Signature Date/Time: 01/25/2024/12:48:45 PM    Final    US  Abdomen Limited RUQ (LIVER/GB) Result Date: 01/25/2024 CLINICAL DATA:  88 year old female with right abdominal pain, nausea vomiting, suspicious CT appearance of the gallbladder this morning. EXAM: ULTRASOUND ABDOMEN LIMITED RIGHT UPPER QUADRANT COMPARISON:  CT Abdomen and Pelvis 0502 hours. FINDINGS: Gallbladder: Similar gallbladder distension. Small (4 mm) areas of layering sludge or nonshadowing stones (image 11). Gallbladder wall thickness is at the upper limits of normal, 3 mm. No sonographic Murphy sign elicited. No pericholecystic fluid. Common bile duct: Diameter: 2 mm, normal. Liver: No focal lesion identified. Within normal limits in parenchymal echogenicity. Portal vein is patent on color Doppler imaging with normal direction of blood flow towards the liver. Other: Negative visible right kidney. IMPRESSION: 1. Distended gallbladder with small stones and/or foci of sludge. Wall thickness at the upper limits of normal. But absent sonographic Abigail Abler sign argues against acute cholecystitis. 2. Negative ultrasound appearance of the liver and bile ducts. Electronically Signed   By: Marlise Simpers M.D.   On: 01/25/2024 06:48   CT ABDOMEN PELVIS W CONTRAST Result Date: 01/25/2024 CLINICAL DATA:  88 year old female with right lower abdominal pain. Nausea vomiting. EXAM: CT ABDOMEN AND PELVIS WITH CONTRAST TECHNIQUE: Multidetector CT imaging of the abdomen and pelvis was  performed using the standard protocol following bolus administration of intravenous contrast. RADIATION DOSE REDUCTION: This exam was performed according to the departmental dose-optimization program which includes automated exposure control, adjustment of the mA and/or kV according to patient size and/or use of iterative reconstruction technique. CONTRAST:  OMNIPAQUE  IOHEXOL  300 MG/ML  SOLN COMPARISON:  Report of abdomen ultrasound 06/26/1994 (no images available). FINDINGS: Lower chest: Curvilinear bilateral lower lobe lung base scarring. Heart size at the upper limits of normal. No pericardial or pleural effusion. Hepatobiliary: Gallbladder somewhat distended but otherwise within normal limits. Bile ducts appear within normal limits for age. Liver enhancement is within normal limits; subcentimeter but circumscribed low-density area in the inferior right hepatic lobe (coronal image 59) is compatible with benign cyst (no follow-up imaging recommended). Pancreas: Negative. Spleen: Negative.  Splenule, normal variant. Adrenals/Urinary Tract: Normal adrenal glands. Nonobstructed kidneys with symmetric renal enhancement and contrast excretion. Diminutive ureters. Bladder obscured by hip arthroplasty artifact. Stomach/Bowel: Rectum and distal sigmoid colon obscured by hip arthroplasty artifact. Retained stool throughout redundant transverse colon, splenic flexure, descending colon and proximal sigmoid. Less pronounced retained stool in the right colon. Normal appendix is identified (coronal images 52 and 65. No large bowel inflammation identified. Terminal ileum is decompressed. No dilated small bowel. Small volume fluid in the gastric fundus. Decompressed distal stomach and duodenum. No pneumoperitoneum or free fluid identified. No mesenteric inflammation identified. Vascular/Lymphatic: Advanced Calcified aortic atherosclerosis. Major arterial structures in the abdomen and pelvis remain patent despite bulky  calcified atherosclerosis. Abdominal aortic size remains normal. Portal venous system is patent. No lymphadenopathy. Reproductive: Partially obscured by hip arthroplasty artifact, otherwise negative. Other: Hip arthroplasty streak artifact limits pelvic visualization. No free fluid is evident. Musculoskeletal: Bilateral hip arthroplasty with streak artifact. Lumbar spine degeneration including mild grade 1 spondylolisthesis at L4-L5 with vacuum disc. Bulky lumbar facet arthropathy greater on the right. No acute osseous abnormality identified. IMPRESSION: 1.  Normal appendix. No acute or inflammatory process identified in the abdomen. But retained stool throughout redundant large bowel, consider constipation. 2. Bilateral hip arthroplasty artifact obscures most visualization of the pelvis. 3. Severe  Aortic Atherosclerosis (ICD10-I70.0). Electronically Signed   By: Marlise Simpers M.D.   On: 01/25/2024 05:20       Discharge Exam: Vitals:   01/27/24 0759 01/27/24 1157  BP: (!) 178/63 (!) 164/65  Pulse: 65   Resp: 14   Temp: 99.2 F (37.3 C) 99 F (37.2 C)  SpO2: 96% 98%    General: Pt is alert, awake, not in acute distress Cardiovascular: RRR, S1/S2 +, no edema Respiratory: CTA bilaterally, no wheezing, no rhonchi, no respiratory distress, no conversational dyspnea  Abdominal: Soft, NT, ND, bowel sounds + Extremities: no edema, no cyanosis Psych: Normal mood and affect, stable judgement and insight     The results of significant diagnostics from this hospitalization (including imaging, microbiology, ancillary and laboratory) are listed below for reference.     Microbiology: No results found for this or any previous visit (from the past 240 hours).   Labs: BNP (last 3 results) No results for input(s): "BNP" in the last 8760 hours. Basic Metabolic Panel: Recent Labs  Lab 01/25/24 0328 01/25/24 0637 01/26/24 0335 01/27/24 0439  NA 140  --  134* 139  K 3.3*  --  4.1 4.1  CL 103  --  107  108  CO2 24  --  20* 23  GLUCOSE 209*  --  92 93  BUN 15  --  17 17  CREATININE 0.89  --  1.03* 1.04*  CALCIUM 9.6  --  8.3* 8.6*  MG  --  2.0  --   --   PHOS  --  3.0  --   --    Liver Function Tests: Recent Labs  Lab 01/25/24 0328 01/26/24 0335 01/27/24 0439  AST 181* 138* 67*  ALT 76* 160* 112*  ALKPHOS 104 94 96  BILITOT 0.8 2.8* 0.9  PROT 7.4 6.3* 6.3*  ALBUMIN 4.3 3.1* 2.9*   Recent Labs  Lab 01/25/24 0328  LIPASE 55*   No results for input(s): "AMMONIA" in the last 168 hours. CBC: Recent Labs  Lab 01/25/24 0328 01/26/24 0335 01/27/24 0439  WBC 8.5 8.4 9.0  NEUTROABS 7.6  --   --   HGB 10.6* 9.0* 9.4*  HCT 32.9* 28.7* 30.4*  MCV 88.2 93.5 92.4  PLT 222 133* 152   Cardiac Enzymes: No results for input(s): "CKTOTAL", "CKMB", "CKMBINDEX", "TROPONINI" in the last 168 hours. BNP: Invalid input(s): "POCBNP" CBG: Recent Labs  Lab 01/26/24 0557 01/26/24 1633 01/26/24 2059 01/27/24 0755 01/27/24 1146  GLUCAP 84 121* 127* 85 142*   D-Dimer No results for input(s): "DDIMER" in the last 72 hours. Hgb A1c Recent Labs    01/27/24 0439  HGBA1C 5.8*   Lipid Profile No results for input(s): "CHOL", "HDL", "LDLCALC", "TRIG", "CHOLHDL", "LDLDIRECT" in the last 72 hours. Thyroid function studies No results for input(s): "TSH", "T4TOTAL", "T3FREE", "THYROIDAB" in the last 72 hours.  Invalid input(s): "FREET3" Anemia work up Recent Labs    01/27/24 0439  VITAMINB12 571  FOLATE 18.8  FERRITIN 63  TIBC 301  IRON 15*   Urinalysis    Component Value Date/Time   COLORURINE YELLOW 01/25/2024 0530   APPEARANCEUR CLEAR 01/25/2024 0530   LABSPEC 1.015 01/25/2024 0530   PHURINE 6.0 01/25/2024 0530   GLUCOSEU NEGATIVE 01/25/2024 0530   HGBUR TRACE (A) 01/25/2024  0530   BILIRUBINUR NEGATIVE 01/25/2024 0530   KETONESUR NEGATIVE 01/25/2024 0530   PROTEINUR NEGATIVE 01/25/2024 0530   UROBILINOGEN 1.0 10/12/2009 1004   NITRITE NEGATIVE 01/25/2024 0530    LEUKOCYTESUR NEGATIVE 01/25/2024 0530   Sepsis Labs Recent Labs  Lab 01/25/24 0328 01/26/24 0335 01/27/24 0439  WBC 8.5 8.4 9.0   Microbiology No results found for this or any previous visit (from the past 240 hours).   Patient was seen and examined on the day of discharge and was found to be in stable condition. Time coordinating discharge: 25 minutes including assessment and coordination of care, as well as examination of the patient.   SIGNED:  Daren Eck, DO [facility] 01/27/2024, 12:05 PM

## 2024-01-27 NOTE — Progress Notes (Signed)
 1 Day Post-Op   Subjective/Chief Complaint: Looks great post op.  Minimal pain.  Eating breakfast.     Objective: Vital signs in last 24 hours: Temp:  [97.7 F (36.5 C)-99.4 F (37.4 C)] 99.2 F (37.3 C) (05/17 0759) Pulse Rate:  [64-75] 65 (05/17 0759) Resp:  [14-20] 14 (05/17 0759) BP: (135-202)/(44-63) 178/63 (05/17 0759) SpO2:  [93 %-100 %] 96 % (05/17 0759) Weight:  [52.3 kg] 52.3 kg (05/16 1002) Last BM Date : 01/24/24  Intake/Output from previous day: 05/16 0701 - 05/17 0700 In: 1029.9 [P.O.:120; I.V.:756.9; IV Piggyback:153] Out: 20 [Blood:20] Intake/Output this shift: No intake/output data recorded.  Abdomen: non tender, non distended, inc c/d/I.   Lab Results:  Recent Labs    01/26/24 0335 01/27/24 0439  WBC 8.4 9.0  HGB 9.0* 9.4*  HCT 28.7* 30.4*  PLT 133* 152   BMET Recent Labs    01/26/24 0335 01/27/24 0439  NA 134* 139  K 4.1 4.1  CL 107 108  CO2 20* 23  GLUCOSE 92 93  BUN 17 17  CREATININE 1.03* 1.04*  CALCIUM 8.3* 8.6*   PT/INR No results for input(s): "LABPROT", "INR" in the last 72 hours. ABG No results for input(s): "PHART", "HCO3" in the last 72 hours.  Invalid input(s): "PCO2", "PO2"  Studies/Results: DG Cholangiogram Operative Result Date: 01/26/2024 CLINICAL DATA:  Status post cholecystectomy. EXAM: INTRAOPERATIVE CHOLANGIOGRAM TECHNIQUE: Cholangiographic images from the C-arm fluoroscopic device were submitted for interpretation post-operatively. Please see the procedural report for the amount of contrast and the fluoroscopy time utilized. FLUOROSCOPY: Radiation Exposure Index (as provided by the fluoroscopic device): 2.5394 MGy Kerma COMPARISON:  Jan 25, 2024. FINDINGS: Fluoroscopic guidance was provided while contrast was injected into cystic duct remnant status post cholecystectomy. No definite filling defect is seen to suggest residual stone. Antegrade flow of contrast into duodenum is noted. Some extravasation of contrast is  seen from the cystic duct remnant. IMPRESSION: Fluoroscopic guidance provided during contrast injection status post cholecystectomy. Electronically Signed   By: Rosalene Colon M.D.   On: 01/26/2024 14:44   ECHOCARDIOGRAM COMPLETE Result Date: 01/25/2024    ECHOCARDIOGRAM REPORT   Patient Name:   Crystal Richard Date of Exam: 01/25/2024 Medical Rec #:  [id number]      Height:       64.0 in Accession #:    [id number]     Weight:       110.0 lb Date of Birth:  [birth year]       BSA:          1.517 m Patient Age:    87 years       BP:           150/50 mmHg Patient Gender: F              HR:           81 bpm. Exam Location:  Inpatient Procedure: 2D Echo, Cardiac Doppler and Color Doppler (Both Spectral and Color            Flow Doppler were utilized during procedure). Indications:    Abnormal ECG, preoperative evaluation  History:        Patient has prior history of Echocardiogram examinations, most                 recent 04/23/2017. Risk Factors:Hypertension, Dyslipidemia and                 Diabetes. CKD stage 3.  Sonographer:  Aldon Ambrosia Referring Phys: [id number] DAVID MANUEL ORTIZ IMPRESSIONS  1. Left ventricular ejection fraction, by estimation, is 60 to 65%. The left ventricle has normal function. The left ventricle has no regional wall motion abnormalities. There is mild left ventricular hypertrophy of the basal-septal segment. Left ventricular diastolic parameters are consistent with Grade I diastolic dysfunction (impaired relaxation).  2. Right ventricular systolic function is normal. The right ventricular size is normal. There is moderately elevated pulmonary artery systolic pressure. The estimated right ventricular systolic pressure is 55.7 mmHg.  3. The mitral valve is normal in structure. Trivial mitral valve regurgitation. No evidence of mitral stenosis.  4. The aortic valve is tricuspid. Aortic valve regurgitation is trivial. Aortic valve sclerosis/calcification is present, without any evidence of aortic  stenosis. Aortic regurgitation PHT measures 553 msec. Aortic valve area, by VTI measures 1.78 cm. Aortic valve mean gradient measures 6.0 mmHg. Aortic valve Vmax measures 1.62 m/s.  5. The inferior vena cava is dilated in size with <50% respiratory variability, suggesting right atrial pressure of 15 mmHg. FINDINGS  Left Ventricle: Left ventricular ejection fraction, by estimation, is 60 to 65%. The left ventricle has normal function. The left ventricle has no regional wall motion abnormalities. The left ventricular internal cavity size was normal in size. There is  mild left ventricular hypertrophy of the basal-septal segment. Left ventricular diastolic parameters are consistent with Grade I diastolic dysfunction (impaired relaxation). Normal left ventricular filling pressure. Right Ventricle: The right ventricular size is normal. No increase in right ventricular wall thickness. Right ventricular systolic function is normal. There is moderately elevated pulmonary artery systolic pressure. The tricuspid regurgitant velocity is 3.19 m/s, and with an assumed right atrial pressure of 15 mmHg, the estimated right ventricular systolic pressure is 55.7 mmHg. Left Atrium: Left atrial size was normal in size. Right Atrium: Right atrial size was normal in size. Pericardium: There is no evidence of pericardial effusion. Mitral Valve: The mitral valve is normal in structure. Trivial mitral valve regurgitation. No evidence of mitral valve stenosis. MV peak gradient, 8.2 mmHg. The mean mitral valve gradient is 3.0 mmHg. Tricuspid Valve: The tricuspid valve is normal in structure. Tricuspid valve regurgitation is trivial. No evidence of tricuspid stenosis. Aortic Valve: The aortic valve is tricuspid. Aortic valve regurgitation is trivial. Aortic regurgitation PHT measures 553 msec. Aortic valve sclerosis/calcification is present, without any evidence of aortic stenosis. Aortic valve mean gradient measures 6.0 mmHg. Aortic valve  peak gradient measures 10.5 mmHg. Aortic valve area, by VTI measures 1.78 cm. Pulmonic Valve: The pulmonic valve was normal in structure. Pulmonic valve regurgitation is not visualized. No evidence of pulmonic stenosis. Aorta: The aortic root is normal in size and structure. Venous: The inferior vena cava is dilated in size with less than 50% respiratory variability, suggesting right atrial pressure of 15 mmHg. IAS/Shunts: No atrial level shunt detected by color flow Doppler.  LEFT VENTRICLE PLAX 2D LVIDd:         3.90 cm      Diastology LVIDs:         2.50 cm      LV e' medial:    6.42 cm/s LV PW:         0.90 cm      LV E/e' medial:  14.1 LV IVS:        1.10 cm      LV e' lateral:   9.03 cm/s LVOT diam:     1.80 cm      LV E/e'  lateral: 10.0 LV SV:         64 LV SV Index:   42 LVOT Area:     2.54 cm  LV Volumes (MOD) LV vol d, MOD A2C: 89.5 ml LV vol d, MOD A4C: 102.0 ml LV vol s, MOD A2C: 30.7 ml LV vol s, MOD A4C: 30.0 ml LV SV MOD A2C:     58.8 ml LV SV MOD A4C:     102.0 ml LV SV MOD BP:      66.9 ml RIGHT VENTRICLE             IVC RV Basal diam:  3.50 cm     IVC diam: 2.30 cm RV Mid diam:    2.30 cm RV S prime:     14.10 cm/s TAPSE (M-mode): 2.7 cm LEFT ATRIUM             Index        RIGHT ATRIUM           Index LA diam:        3.10 cm 2.04 cm/m   RA Area:     15.80 cm LA Vol (A2C):   61.9 ml 40.79 ml/m  RA Volume:   40.10 ml  26.43 ml/m LA Vol (A4C):   32.6 ml 21.48 ml/m LA Biplane Vol: 49.2 ml 32.42 ml/m  AORTIC VALVE                     PULMONIC VALVE AV Area (Vmax):    2.14 cm      PV Vmax:       0.98 m/s AV Area (Vmean):   1.66 cm      PV Peak grad:  3.9 mmHg AV Area (VTI):     1.78 cm AV Vmax:           162.00 cm/s AV Vmean:          120.000 cm/s AV VTI:            0.357 m AV Peak Grad:      10.5 mmHg AV Mean Grad:      6.0 mmHg LVOT Vmax:         136.00 cm/s LVOT Vmean:        78.400 cm/s LVOT VTI:          0.250 m LVOT/AV VTI ratio: 0.70 AI PHT:            553 msec  AORTA Ao Root diam:  2.90 cm Ao Asc diam:  2.40 cm MITRAL VALVE                TRICUSPID VALVE MV Area (PHT): 3.81 cm     TR Peak grad:   40.7 mmHg MV Area VTI:   2.06 cm     TR Vmax:        319.00 cm/s MV Peak grad:  8.2 mmHg MV Mean grad:  3.0 mmHg     SHUNTS MV Vmax:       1.43 m/s     Systemic VTI:  0.25 m MV Vmean:      77.9 cm/s    Systemic Diam: 1.80 cm MV Decel Time: 199 msec MV E velocity: 90.40 cm/s MV A velocity: 125.00 cm/s MV E/A ratio:  0.72 Gaylyn Keas MD Electronically signed by Gaylyn Keas MD Signature Date/Time: 01/25/2024/12:48:45 PM    Final     Anti-infectives: Anti-infectives (From admission, onward)    Start  Dose/Rate Route Frequency Ordered Stop   01/26/24 0600  cefTRIAXone  (ROCEPHIN ) 2 g in sodium chloride  0.9 % 100 mL IVPB        2 g 200 mL/hr over 30 Minutes Intravenous Every 24 hours 01/25/24 0958     01/25/24 1015  metroNIDAZOLE  (FLAGYL ) IVPB 500 mg        500 mg 100 mL/hr over 60 Minutes Intravenous 2 times daily 01/25/24 0958     01/25/24 0715  cefTRIAXone  (ROCEPHIN ) 2 g in sodium chloride  0.9 % 100 mL IVPB        2 g 200 mL/hr over 30 Minutes Intravenous  Once 01/25/24 1610 01/25/24 0805       Assessment/Plan: Acute cholecystitis- S/p lap chole with neg IOC 5/16 cornett.   Ok for d/c from surgery standpoint when cleared by medicine.   Follow up, post op instructions in discharge navigator, and narcotics sent.     LOS: 2 days    Crystal Richard 01/27/2024 High complexity

## 2024-01-27 NOTE — Plan of Care (Signed)

## 2024-01-29 ENCOUNTER — Other Ambulatory Visit: Payer: Self-pay

## 2024-01-29 ENCOUNTER — Encounter (HOSPITAL_COMMUNITY): Payer: Self-pay | Admitting: Surgery

## 2024-01-29 LAB — SURGICAL PATHOLOGY

## 2024-01-29 NOTE — Anesthesia Postprocedure Evaluation (Signed)
 Anesthesia Post Note  Patient: Crystal Richard  Procedure(s) Performed: LAPAROSCOPIC CHOLECYSTECTOMY WITH INTRAOPERATIVE CHOLANGIOGRAM     Patient location during evaluation: PACU Anesthesia Type: General Level of consciousness: awake and alert Pain management: pain level controlled Vital Signs Assessment: post-procedure vital signs reviewed and stable Respiratory status: spontaneous breathing, nonlabored ventilation, respiratory function stable and patient connected to nasal cannula oxygen Cardiovascular status: blood pressure returned to baseline and stable Postop Assessment: no apparent nausea or vomiting Anesthetic complications: no  No notable events documented.  Last Vitals:  Vitals:   01/27/24 0759 01/27/24 1157  BP: (!) 178/63 (!) 164/65  Pulse: 65   Resp: 14   Temp: 37.3 C 37.2 C  SpO2: 96% 98%    Last Pain:  Vitals:   01/27/24 0800  TempSrc:   PainSc: 0-No pain                 Benino Korinek L Cherrelle Plante
